# Patient Record
Sex: Female | Born: 1940 | Race: White | Hispanic: No | Marital: Married | Smoking: Former smoker
Health system: Southern US, Community
[De-identification: ages and names within clinical notes are randomized; demographics above are authoritative.]

## PROBLEM LIST (undated history)

## (undated) DIAGNOSIS — M797 Fibromyalgia: Secondary | ICD-10-CM

## (undated) DIAGNOSIS — I10 Essential (primary) hypertension: Secondary | ICD-10-CM

## (undated) DIAGNOSIS — K2270 Barrett's esophagus without dysplasia: Secondary | ICD-10-CM

## (undated) DIAGNOSIS — E785 Hyperlipidemia, unspecified: Secondary | ICD-10-CM

## (undated) HISTORY — PX: TONSILLECTOMY: SUR1361

## (undated) HISTORY — PX: ABDOMINAL HYSTERECTOMY: SHX81

## (undated) HISTORY — DX: Essential (primary) hypertension: I10

## (undated) HISTORY — DX: Fibromyalgia: M79.7

## (undated) HISTORY — DX: Hyperlipidemia, unspecified: E78.5

## (undated) HISTORY — DX: Barrett's esophagus without dysplasia: K22.70

## (undated) HISTORY — PX: SHOULDER ARTHROSCOPY: SHX128

---

## 1968-06-26 HISTORY — PX: OOPHORECTOMY: SHX86

## 2001-06-26 HISTORY — PX: SHOULDER ARTHROSCOPY W/ LABRAL REPAIR: SHX2399

## 2004-06-26 HISTORY — PX: KNEE ARTHROSCOPY: SHX127

## 2004-10-17 ENCOUNTER — Emergency Department: Payer: Self-pay | Admitting: Internal Medicine

## 2005-12-11 ENCOUNTER — Ambulatory Visit: Payer: Self-pay | Admitting: Unknown Physician Specialty

## 2007-04-23 ENCOUNTER — Emergency Department: Payer: Self-pay | Admitting: Internal Medicine

## 2007-04-26 ENCOUNTER — Emergency Department: Payer: Self-pay | Admitting: Emergency Medicine

## 2007-04-30 ENCOUNTER — Emergency Department: Payer: Self-pay

## 2007-05-07 ENCOUNTER — Emergency Department: Payer: Self-pay | Admitting: Unknown Physician Specialty

## 2007-05-21 ENCOUNTER — Emergency Department: Payer: Self-pay | Admitting: Emergency Medicine

## 2008-10-26 ENCOUNTER — Ambulatory Visit: Payer: Self-pay | Admitting: Rheumatology

## 2008-11-04 ENCOUNTER — Ambulatory Visit: Payer: Self-pay | Admitting: Internal Medicine

## 2008-11-05 ENCOUNTER — Ambulatory Visit: Payer: Self-pay | Admitting: Unknown Physician Specialty

## 2009-02-18 ENCOUNTER — Ambulatory Visit: Payer: Self-pay | Admitting: Internal Medicine

## 2009-09-06 ENCOUNTER — Ambulatory Visit: Payer: Self-pay | Admitting: Anesthesiology

## 2009-09-24 ENCOUNTER — Ambulatory Visit: Payer: Self-pay | Admitting: Anesthesiology

## 2009-10-27 ENCOUNTER — Ambulatory Visit: Payer: Self-pay | Admitting: Anesthesiology

## 2009-12-15 ENCOUNTER — Ambulatory Visit: Payer: Self-pay | Admitting: Anesthesiology

## 2010-02-04 ENCOUNTER — Ambulatory Visit: Payer: Self-pay | Admitting: Anesthesiology

## 2010-04-04 ENCOUNTER — Ambulatory Visit: Payer: Self-pay | Admitting: Internal Medicine

## 2010-05-17 ENCOUNTER — Ambulatory Visit: Payer: Self-pay | Admitting: Anesthesiology

## 2010-06-06 ENCOUNTER — Ambulatory Visit: Payer: Self-pay | Admitting: Unknown Physician Specialty

## 2010-06-10 LAB — HM PAP SMEAR

## 2010-06-29 ENCOUNTER — Ambulatory Visit: Payer: Self-pay | Admitting: Internal Medicine

## 2010-07-22 ENCOUNTER — Ambulatory Visit: Payer: Self-pay | Admitting: Unknown Physician Specialty

## 2010-07-26 LAB — PATHOLOGY REPORT

## 2010-09-07 ENCOUNTER — Ambulatory Visit: Payer: Self-pay | Admitting: Ophthalmology

## 2010-09-22 DIAGNOSIS — J309 Allergic rhinitis, unspecified: Secondary | ICD-10-CM | POA: Insufficient documentation

## 2010-09-22 DIAGNOSIS — T7800XA Anaphylactic reaction due to unspecified food, initial encounter: Secondary | ICD-10-CM | POA: Insufficient documentation

## 2011-07-11 ENCOUNTER — Ambulatory Visit: Payer: Self-pay | Admitting: Ophthalmology

## 2011-07-25 ENCOUNTER — Ambulatory Visit: Payer: Self-pay | Admitting: Ophthalmology

## 2011-10-02 ENCOUNTER — Other Ambulatory Visit: Payer: Self-pay | Admitting: Internal Medicine

## 2011-11-13 ENCOUNTER — Ambulatory Visit: Payer: Self-pay | Admitting: Internal Medicine

## 2011-11-22 ENCOUNTER — Other Ambulatory Visit: Payer: Self-pay | Admitting: *Deleted

## 2011-11-22 NOTE — Telephone Encounter (Signed)
Pharmacy notified as instructed by telephone. 

## 2011-11-22 NOTE — Telephone Encounter (Signed)
Received faxed refill request from pharmacy for Tramadol 50 mg, take 2 tablets twice daily. Medication is not on med sheet. Patient no showed for her last appointment.

## 2011-11-22 NOTE — Telephone Encounter (Signed)
Refill denied because she has not bee seen here and I have no records on her

## 2011-11-27 ENCOUNTER — Telehealth: Payer: Self-pay | Admitting: Internal Medicine

## 2011-11-27 MED ORDER — TRAMADOL HCL 50 MG PO TABS: 100.0000 mg | ORAL_TABLET | Freq: Two times a day (BID) | ORAL | Status: DC | PRN

## 2011-11-27 NOTE — Telephone Encounter (Signed)
I will authorize one refill of whaever her prior amount was for .   No more refills after that until she is seen.

## 2011-11-27 NOTE — Telephone Encounter (Signed)
Pharmacy notified, Rx called in.

## 2011-11-27 NOTE — Telephone Encounter (Signed)
Tramadol she uses [facility], she states that she has lost a whole bottle of this medication over the weekend.  She states that last week her husband had to have emergency colon surgery, and she noticed over the weekend that the whole bottle was gone/missing.  She states that the pharmacy at [facility] did give her 6 pills until she could contact the office today.  Please advise patient, she states that she still haven't gotten her records from [facility] to [place].  She states that on her last bottle of medication it stated no refills.  Please advise, patient is really worried about this.

## 2011-12-07 ENCOUNTER — Encounter: Payer: Self-pay | Admitting: Internal Medicine

## 2011-12-07 ENCOUNTER — Ambulatory Visit (INDEPENDENT_AMBULATORY_CARE_PROVIDER_SITE_OTHER): Payer: Medicare Other | Admitting: Internal Medicine

## 2011-12-07 VITALS — BP 126/72 | HR 72 | Temp 98.2°F | Resp 14 | Ht 64.0 in | Wt 142.5 lb

## 2011-12-07 DIAGNOSIS — F411 Generalized anxiety disorder: Secondary | ICD-10-CM

## 2011-12-07 DIAGNOSIS — Z1239 Encounter for other screening for malignant neoplasm of breast: Secondary | ICD-10-CM

## 2011-12-07 DIAGNOSIS — R4589 Other symptoms and signs involving emotional state: Secondary | ICD-10-CM

## 2011-12-07 DIAGNOSIS — IMO0002 Reserved for concepts with insufficient information to code with codable children: Secondary | ICD-10-CM

## 2011-12-07 DIAGNOSIS — S46919A Strain of unspecified muscle, fascia and tendon at shoulder and upper arm level, unspecified arm, initial encounter: Secondary | ICD-10-CM

## 2011-12-07 DIAGNOSIS — K2270 Barrett's esophagus without dysplasia: Secondary | ICD-10-CM

## 2011-12-07 MED ORDER — NABUMETONE 750 MG PO TABS: 750.0000 mg | ORAL_TABLET | Freq: Two times a day (BID) | ORAL | Status: DC

## 2011-12-07 MED ORDER — TRAMADOL HCL 50 MG PO TABS: 100.0000 mg | ORAL_TABLET | Freq: Two times a day (BID) | ORAL | Status: AC | PRN

## 2011-12-07 MED ORDER — ALPRAZOLAM 0.5 MG PO TABS: 0.5000 mg | ORAL_TABLET | Freq: Every evening | ORAL | Status: AC | PRN

## 2011-12-07 MED ORDER — ESTRADIOL 1 MG PO TABS: 1.0000 mg | ORAL_TABLET | Freq: Every day | ORAL | Status: DC

## 2011-12-07 NOTE — Patient Instructions (Addendum)
I am starting you on nabumetone,  An anti inflammatory for your muscle strain.  Take it twice daily for a few weeks..  And increase tramadol to 4 daily   Use the alprazolam sparingly for severe anxiety or anger   I recommend using OTC prilosec (omeprazole 20 mg) daily while taking the anti inflammatory to protect stomach

## 2011-12-07 NOTE — Progress Notes (Signed)
Patient ID: Colleen Cooper, female   DOB: [birth year], 71 y.o.   MRN: [id number] Patient Active Problem List  Diagnosis  . Barrett's esophagus  . Fibromyalgia syndrome  . Muscle strain of upper arm  . Anxiety as acute reaction to exceptional stress    Subjective:  CC:   Chief Complaint  Patient presents with  . Follow-up    HPI:   Colleen Cooper a 71 y.o. female who presents with bilateral triceps and lateral back pain. Colleen Cooper reports emotional turmoil at home due to husband's recent diagnosis of colon CA. Her husband underwent a partial colectomy and is now on chemotherapy.  His biopolar disorder has been uncontrolled since he started chemotherapy initially because of vomiting, and he remains persistently agitated, resulting in loss of sleep  For patient. Her cc is bilateral muscle pain involving tricepsand latissimus dorsi.  The pain is sharp and burning,  Started a week and a half ago, after using a tiller in her garden for several hours a few days before that.  The pain has not improved with tramadol, flexeril, and hydrocodone   Past Medical History  Diagnosis Date  . Barrett's esophagus     resolved by last endoscopy Dec 2011  . Fibromyalgia syndrome     Past Surgical History  Procedure Date  . Shoulder arthroscopy w/ labral repair 2003    right shoulder   . Abdominal hysterectomy   . Oophorectomy 1970  . Knee arthroscopy 2006    right knee  . Shoulder arthroscopy     remote  . Tonsillectomy          The following portions of the patient's history were reviewed and updated as appropriate: Allergies, current medications, and problem list.    Review of Systems:   12 Pt  review of systems was negative except those addressed in the HPI,     History   Social History  . Marital Status: Unknown    Spouse Name: N/A    Number of Children: N/A  . Years of Education: N/A   Occupational History  . Not on file.   Social History Main Topics  .  Smoking status: Former Smoker    Quit date: 12/07/1978  . Smokeless tobacco: Never Used  . Alcohol Use: Yes  . Drug Use: No  . Sexually Active: Not on file   Other Topics Concern  . Not on file   Social History Narrative  . No narrative on file    Objective:  BP 126/72  Pulse 72  Temp 98.2 F (36.8 C) (Oral)  Resp 14  Ht 5\' 4"  (1.626 m)  Wt 142 lb 8 oz (64.638 kg)  BMI 24.46 kg/m2  SpO2 97%  General appearance: alert, cooperative and appears stated age Ears: normal TM's and external ear canals both ears Throat: lips, mucosa, and tongue normal; teeth and gums normal Neck: no adenopathy, no carotid bruit, supple, symmetrical, trachea midline and thyroid not enlarged, symmetric, no tenderness/mass/nodules Back: symmetric, no curvature. ROM normal. No CVA tenderness. Lungs: clear to auscultation bilaterally Heart: regular rate and rhythm, S1, S2 normal, no murmur, click, rub or gallop Abdomen: soft, non-tender; bowel sounds normal; no masses,  no organomegaly Pulses: 2+ and symmetric Skin: Skin color, texture, turgor normal. No rashes or lesions Lymph nodes: Cervical, supraclavicular, and axillary nodes normal. MSK: upper extremities tender in the triceps bilaterally.  Bilateral latissimus dorsi tenderness.  No rashes, lesions or masses.   Assessment and Plan:  Barrett's esophagus Resolved by  follow up  EGD. She has not been taking Prevacid, was taking omega 3 due to advice from health food store clerk!  We discussed the lack of data on the effectiveness of fish oil on reflux and I advised her to  resume prevacid.   Muscle strain of upper arm Bilateral, secondary to recent use of tiller,  Aggravated by fibromyalgia.  Adding nabumetone for inflammation, ice.   Anxiety as acute reaction to exceptional stress Secondary  To husband's illness, with insomnia and irritability the major issues..  Discussed trial of alprazolam for short term use.     Updated Medication  List Outpatient Encounter Prescriptions as of 12/07/2011  Medication Sig Dispense Refill  . estradiol (ESTRACE) 1 MG tablet Take 1 tablet (1 mg total) by mouth daily.  90 tablet  3  . traMADol (ULTRAM) 50 MG tablet Take 2 tablets (100 mg total) by mouth 2 (two) times daily as needed for pain.  120 tablet  3  . DISCONTD: estradiol (ESTRACE) 1 MG tablet Take 1 mg by mouth daily.      Marland Kitchen DISCONTD: traMADol (ULTRAM) 50 MG tablet Take 2 tablets (100 mg total) by mouth 2 (two) times daily as needed for pain.  120 tablet  0  . ALPRAZolam (XANAX) 0.5 MG tablet Take 1 tablet (0.5 mg total) by mouth at bedtime as needed for sleep or anxiety.  30 tablet  3  . nabumetone (RELAFEN) 750 MG tablet Take 1 tablet (750 mg total) by mouth 2 (two) times daily.  60 tablet  2     Orders Placed This Encounter  Procedures  . MM Digital Screening  . HM MAMMOGRAPHY  . HM PAP SMEAR  . HM COLONOSCOPY    Return in about 3 months (around 03/08/2012).

## 2011-12-07 NOTE — Assessment & Plan Note (Addendum)
Resolved by follow up  EGD. She has not been taking Prevacid, was taking omega 3 due to advice from health food store clerk!  We discussed the lack of data on the effectiveness of fish oil on reflux and I advised her to  resume prevacid.

## 2011-12-10 ENCOUNTER — Encounter: Payer: Self-pay | Admitting: Internal Medicine

## 2011-12-10 DIAGNOSIS — S46919A Strain of unspecified muscle, fascia and tendon at shoulder and upper arm level, unspecified arm, initial encounter: Secondary | ICD-10-CM | POA: Insufficient documentation

## 2011-12-10 DIAGNOSIS — F4321 Adjustment disorder with depressed mood: Secondary | ICD-10-CM | POA: Insufficient documentation

## 2011-12-10 DIAGNOSIS — M797 Fibromyalgia: Secondary | ICD-10-CM | POA: Insufficient documentation

## 2011-12-10 DIAGNOSIS — F4329 Adjustment disorder with other symptoms: Secondary | ICD-10-CM | POA: Insufficient documentation

## 2011-12-10 DIAGNOSIS — F4381 Prolonged grief disorder: Secondary | ICD-10-CM | POA: Insufficient documentation

## 2011-12-10 NOTE — Assessment & Plan Note (Signed)
Bilateral, secondary to recent use of tiller,  Aggravated by fibromyalgia.  Adding nabumetone for inflammation, ice.

## 2011-12-10 NOTE — Assessment & Plan Note (Signed)
Secondary  To husband's illness, with insomnia and irritability the major issues..  Discussed trial of alprazolam for short term use.

## 2011-12-25 ENCOUNTER — Other Ambulatory Visit: Payer: Medicare Other

## 2011-12-25 ENCOUNTER — Telehealth: Payer: Self-pay | Admitting: *Deleted

## 2011-12-25 DIAGNOSIS — E785 Hyperlipidemia, unspecified: Secondary | ICD-10-CM

## 2011-12-25 DIAGNOSIS — R5383 Other fatigue: Secondary | ICD-10-CM

## 2011-12-25 NOTE — Telephone Encounter (Signed)
orderrs now in epic thanks

## 2011-12-25 NOTE — Telephone Encounter (Signed)
Patient is coming in for labs today, what labs do you want me to order?

## 2012-01-02 ENCOUNTER — Telehealth: Payer: Self-pay | Admitting: Internal Medicine

## 2012-01-02 DIAGNOSIS — R5383 Other fatigue: Secondary | ICD-10-CM

## 2012-01-02 DIAGNOSIS — E785 Hyperlipidemia, unspecified: Secondary | ICD-10-CM

## 2012-01-02 DIAGNOSIS — R350 Frequency of micturition: Secondary | ICD-10-CM

## 2012-01-02 DIAGNOSIS — E559 Vitamin D deficiency, unspecified: Secondary | ICD-10-CM

## 2012-01-02 NOTE — Telephone Encounter (Signed)
Patient is coming in tomorrow 01/03/12 for labs, she would like to add a urinalysis. She forgot to mention in her last visit that something is "just not right" she feels like she is urinating a lot even more than she is drinking. She does not complain of pain or fever.

## 2012-01-03 ENCOUNTER — Telehealth: Payer: Self-pay | Admitting: *Deleted

## 2012-01-03 ENCOUNTER — Other Ambulatory Visit (INDEPENDENT_AMBULATORY_CARE_PROVIDER_SITE_OTHER): Payer: Medicare Other | Admitting: *Deleted

## 2012-01-03 DIAGNOSIS — R5383 Other fatigue: Secondary | ICD-10-CM

## 2012-01-03 DIAGNOSIS — R350 Frequency of micturition: Secondary | ICD-10-CM

## 2012-01-03 DIAGNOSIS — E559 Vitamin D deficiency, unspecified: Secondary | ICD-10-CM

## 2012-01-03 DIAGNOSIS — E785 Hyperlipidemia, unspecified: Secondary | ICD-10-CM

## 2012-01-03 DIAGNOSIS — R5381 Other malaise: Secondary | ICD-10-CM

## 2012-01-03 LAB — POCT URINALYSIS DIPSTICK
Glucose, UA: NEGATIVE
Ketones, UA: NEGATIVE
Spec Grav, UA: 1.025

## 2012-01-03 LAB — LIPID PANEL
Total CHOL/HDL Ratio: 3
Triglycerides: 178 mg/dL — ABNORMAL HIGH (ref 0.0–149.0)

## 2012-01-03 NOTE — Telephone Encounter (Signed)
Patient came in for labs today and said that her arm pain that she saw you for back in June has not gotten any better and that the med that was called in for her is not helping at all. She is asking if she could get something else called in.

## 2012-01-03 NOTE — Telephone Encounter (Signed)
No, i would prefer that she see an orhtopedist because the only thing stronger is narcotics.  I am happy to refer her. If she has a preference.

## 2012-01-03 NOTE — Addendum Note (Signed)
Addended by: Jobie Quaker on: 01/03/2012 10:55 AM   Modules accepted: Orders

## 2012-01-03 NOTE — Telephone Encounter (Signed)
Patient notified. She says that she wants to give it a couple of weeks and see if it continues. If so she will call back for referral at that time. She doesn't want referral just yet.

## 2012-01-04 LAB — COMPLETE METABOLIC PANEL WITH GFR
ALT: 18 U/L (ref 0–35)
Albumin: 3.8 g/dL (ref 3.5–5.2)
Alkaline Phosphatase: 76 U/L (ref 39–117)
CO2: 29 mEq/L (ref 19–32)
GFR, Est African American: 83 mL/min
Potassium: 4.5 mEq/L (ref 3.5–5.3)
Sodium: 143 mEq/L (ref 135–145)
Total Bilirubin: 0.8 mg/dL (ref 0.3–1.2)
Total Protein: 5.9 g/dL — ABNORMAL LOW (ref 6.0–8.3)

## 2012-01-05 LAB — URINE CULTURE
Colony Count: NO GROWTH
Organism ID, Bacteria: NO GROWTH

## 2012-01-09 ENCOUNTER — Ambulatory Visit: Payer: Self-pay | Admitting: Internal Medicine

## 2012-01-16 ENCOUNTER — Other Ambulatory Visit: Payer: Self-pay | Admitting: Internal Medicine

## 2012-01-16 MED ORDER — TRAMADOL HCL 50 MG PO TABS: 100.0000 mg | ORAL_TABLET | Freq: Two times a day (BID) | ORAL | Status: AC

## 2012-01-16 NOTE — Telephone Encounter (Signed)
Mammogram was normal.   

## 2012-01-16 NOTE — Telephone Encounter (Signed)
Patient notified

## 2012-01-22 ENCOUNTER — Telehealth: Payer: Self-pay | Admitting: Internal Medicine

## 2012-01-22 NOTE — Telephone Encounter (Signed)
Patient notified of results.

## 2012-01-22 NOTE — Telephone Encounter (Signed)
Patient wanting her lab results from 7.10.13 ok to leave a message on voice mail.

## 2012-03-11 ENCOUNTER — Encounter: Payer: Self-pay | Admitting: Internal Medicine

## 2012-03-11 ENCOUNTER — Ambulatory Visit (INDEPENDENT_AMBULATORY_CARE_PROVIDER_SITE_OTHER): Payer: Medicare Other | Admitting: Internal Medicine

## 2012-03-11 VITALS — BP 120/70 | HR 60 | Temp 98.5°F | Resp 14 | Ht 63.5 in | Wt 146.2 lb

## 2012-03-11 DIAGNOSIS — Z1239 Encounter for other screening for malignant neoplasm of breast: Secondary | ICD-10-CM

## 2012-03-11 DIAGNOSIS — M797 Fibromyalgia: Secondary | ICD-10-CM

## 2012-03-11 DIAGNOSIS — S46919A Strain of unspecified muscle, fascia and tendon at shoulder and upper arm level, unspecified arm, initial encounter: Secondary | ICD-10-CM

## 2012-03-11 DIAGNOSIS — Z0000 Encounter for general adult medical examination without abnormal findings: Secondary | ICD-10-CM

## 2012-03-11 MED ORDER — TRAMADOL HCL 50 MG PO TABS: 50.0000 mg | ORAL_TABLET | Freq: Three times a day (TID) | ORAL | Status: DC | PRN

## 2012-03-11 MED ORDER — ESTRADIOL 1 MG PO TABS: 1.0000 mg | ORAL_TABLET | Freq: Every day | ORAL | Status: DC

## 2012-03-11 MED ORDER — PREGABALIN 75 MG PO CAPS: ORAL_CAPSULE | ORAL | Status: DC

## 2012-03-11 NOTE — Progress Notes (Signed)
Patient ID: Colleen Cooper, female   DOB: 03-12-1941, 71 y.o.   MRN: [id number] Annual exam.   The patient is here for annual Medicare wellness examination and management of other chronic and acute problems.  Multiple pain complaints,  arm and shoulder pain finally reslolved Had a steroid shot in left knee prior to last visit by Erin Sons, then the physiatrist at [facility] did bilateral hip injections .  New chronic  complaint of burning/stinging pain occurring at night lasting 10 seconds to 60 seconds ,  Mostly occurring in lateral left thigh, but has occurred over multiple places on her body including under breasts.   The risk factors are reflected in the social history.  The roster of all physicians providing medical care to patient - is listed in the Snapshot section of the chart.  Activities of daily living:  The patient is 100% independent in all ADLs: dressing, toileting, feeding as well as independent mobility  Home safety : The patient has smoke detectors in the home. They wear seatbelts.  There are no firearms at home. There is no violence in the home.   There is no risks for hepatitis, STDs or HIV. There is no   history of blood transfusion. They have no travel history to infectious disease endemic areas of the world.  The patient has seen their dentist in the last six month. They have seen their eye doctor in the last year. They admit to slight hearing difficulty with regard to whispered voices and some television programs.  They have deferred audiologic testing in the last year.  They do not  have excessive sun exposure. Discussed the need for sun protection: hats, long sleeves and use of sunscreen if there is significant sun exposure.   Diet: the importance of a healthy diet is discussed. They do have a healthy diet.  The benefits of regular aerobic exercise were discussed. She walks 4 times per week ,  20 minutes.   Depression screen: there are no signs or vegative symptoms  of depression- irritability, change in appetite, anhedonia, sadness/tearfullness.  Cognitive assessment: the patient manages all their financial and personal affairs and is actively engaged. They could relate day,date,year and events; recalled 2/3 objects at 3 minutes; performed clock-face test normally.  The following portions of the patient's history were reviewed and updated as appropriate: allergies, current medications, past family history, past medical history,  past surgical history, past social history  and problem list.  Visual acuity was not assessed per patient preference since she has regular follow up with her ophthalmologist. Hearing and body mass index were assessed and reviewed.   During the course of the visit the patient was educated and counseled about appropriate screening and preventive services including : fall prevention , diabetes screening, nutrition counseling, colorectal cancer screening, and recommended immunizations.    BP 120/70  Pulse 60  Temp 98.5 F (36.9 C) (Oral)  Resp 14  Ht 5' 3.5" (1.613 m)  Wt 146 lb 4 oz (66.339 kg)  BMI 25.50 kg/m2  SpO2 98%  General Appearance:    Alert, cooperative, no distress, appears stated age  Head:    Normocephalic, without obvious abnormality, atraumatic  Eyes:    PERRL, conjunctiva/corneas clear, EOM's intact, fundi    benign, both eyes  Ears:    Normal TM's and external ear canals, both ears  Nose:   Nares normal, septum midline, mucosa normal, no drainage    or sinus tenderness  Throat:   Lips, mucosa, and  tongue normal; teeth and gums normal  Neck:   Supple, symmetrical, trachea midline, no adenopathy;    thyroid:  no enlargement/tenderness/nodules; no carotid   bruit or JVD  Back:     Symmetric, no curvature, ROM normal, no CVA tenderness  Lungs:     Clear to auscultation bilaterally, respirations unlabored  Chest Wall:    No tenderness or deformity   Heart:    Regular rate and rhythm, S1 and S2 normal, no  murmur, rub   or gallop  Breast Exam:    No tenderness, masses, or nipple abnormality  Abdomen:     Soft, non-tender, bowel sounds active all four quadrants,    no masses, no organomegaly  Genitalia:    Normal female without lesion, discharge or tenderness. Cervix absent, ovary nonpalpable     Extremities:   Extremities normal, atraumatic, no cyanosis or edema  Pulses:   2+ and symmetric all extremities  Skin:   Skin color, texture, turgor normal, no rashes or lesions  Lymph nodes:   Cervical, supraclavicular, and axillary nodes normal  Neurologic:   CNII-XII intact, normal strength, sensation and reflexes    Throughout   Assessment and Plan  Muscle strain of upper arm Symptoms resolved after 3 weeks of pain.  Fibromyalgia syndrome Trial of Lyrica at bedtime for report of neuropathic type pain affecting her in different parts of body. Samples given   Updated Medication List Outpatient Encounter Prescriptions as of 03/11/2012  Medication Sig Dispense Refill  . Ascorbic Acid (VITAMIN C) 100 MG tablet Take 100 mg by mouth daily.      . cholecalciferol (VITAMIN D) 1000 UNITS tablet Take 1,000 Units by mouth daily.      Marland Kitchen estradiol (ESTRACE) 1 MG tablet Take 1 tablet (1 mg total) by mouth daily.  90 tablet  3  . fish oil-omega-3 fatty acids 1000 MG capsule Take 2 g by mouth daily.      . Magnesium 100 MG CAPS Take by mouth.      . DISCONTD: estradiol (ESTRACE) 1 MG tablet Take 1 tablet (1 mg total) by mouth daily.  90 tablet  3  . pregabalin (LYRICA) 75 MG capsule Once daily at bedtime  30 capsule  0  . traMADol (ULTRAM) 50 MG tablet Take 1 tablet (50 mg total) by mouth every 8 (eight) hours as needed for pain.  90 tablet  5  . DISCONTD: nabumetone (RELAFEN) 750 MG tablet Take 1 tablet (750 mg total) by mouth 2 (two) times daily.  60 tablet  2

## 2012-03-11 NOTE — Assessment & Plan Note (Signed)
Symptoms resolved after 3 weeks of pain.

## 2012-03-11 NOTE — Assessment & Plan Note (Signed)
Trial of Lyrica at bedtime for report of neuropathic type pain affecting her in different parts of body. Samples given

## 2012-03-11 NOTE — Patient Instructions (Addendum)
Your cholesterol, thyroid and liver function are fine.    Try the Lyrica once daily at bedtime

## 2012-03-19 ENCOUNTER — Ambulatory Visit (INDEPENDENT_AMBULATORY_CARE_PROVIDER_SITE_OTHER): Payer: Medicare Other | Admitting: Internal Medicine

## 2012-03-19 ENCOUNTER — Encounter: Payer: Self-pay | Admitting: Internal Medicine

## 2012-03-19 VITALS — BP 122/70 | HR 73 | Temp 98.1°F | Ht 63.5 in | Wt 149.0 lb

## 2012-03-19 DIAGNOSIS — J069 Acute upper respiratory infection, unspecified: Secondary | ICD-10-CM

## 2012-03-19 MED ORDER — AMOXICILLIN 875 MG PO TABS: 875.0000 mg | ORAL_TABLET | Freq: Two times a day (BID) | ORAL | Status: DC

## 2012-03-19 MED ORDER — FLUTICASONE PROPIONATE 50 MCG/ACT NA SUSP: 2.0000 | Freq: Every day | NASAL | Status: DC

## 2012-03-19 NOTE — Assessment & Plan Note (Addendum)
Probably viral.  Will treat with Flonase nasal spray and Robitussin as directed.  Have her continue saline nasal flushes 2-3x/day.  I will give her a prescription for Amoxicillin to have if symptoms progress.

## 2012-03-19 NOTE — Progress Notes (Signed)
  Subjective:    Patient ID: Colleen Cooper, female    DOB: 09-Sep-1940, 71 y.o.   MRN: [id number]  HPI  71 year old female who presents for an acute visit with concerns regarding increased post nasal drainage and throat congestion - present over the last two days.    Husband (who is undergoing chemotherapy) has been sick with similar symptoms.  She is supposed to attend a birthday party for her 73 year old granddaughter this weekend.    Outpatient Prescriptions Prior to Visit  Medication Sig Dispense Refill  . Ascorbic Acid (VITAMIN C) 100 MG tablet Take 100 mg by mouth daily.      . cholecalciferol (VITAMIN D) 1000 UNITS tablet Take 1,000 Units by mouth daily.      Marland Kitchen estradiol (ESTRACE) 1 MG tablet Take 1 tablet (1 mg total) by mouth daily.  90 tablet  3  . fish oil-omega-3 fatty acids 1000 MG capsule Take 2 g by mouth daily.      . Magnesium 100 MG CAPS Take by mouth.      . pregabalin (LYRICA) 75 MG capsule Once daily at bedtime  30 capsule  0  . traMADol (ULTRAM) 50 MG tablet Take 1 tablet (50 mg total) by mouth every 8 (eight) hours as needed for pain.  90 tablet  5    Review of Systems  Constitutional: Negative for fever and chills.  HENT: Negative for ear pain.        Reports no sinus pressure.  Increased postnasal drainage and some throat congestion.  No significant sore throat.  No pain with swallowing.   Respiratory: Negative for chest tightness, shortness of breath and wheezing.        No significant cough.   Cardiovascular: Negative for chest pain.  Gastrointestinal: Negative for nausea, vomiting and diarrhea.       Appetite is good.  Eating and drinking well.        Objective:   Physical Exam  Constitutional: No distress.  HENT:  Mouth/Throat: Oropharynx is clear and moist. No oropharyngeal exudate.       TMs without erythema.  Nares - slightly erythematous turbinates.  No sinus tenderness to palpation.   Neck: Neck supple.  Cardiovascular: Normal rate and regular  rhythm.   Pulmonary/Chest: Breath sounds normal. She has no wheezes.       Respirations even and unlabored.   Lymphadenopathy:    She has no cervical adenopathy.          Assessment & Plan:

## 2012-03-19 NOTE — Patient Instructions (Signed)
It was nice meeting you today.  I am sorry you are not feeling well.  I have prescribed Flonase nasal spray to use daily as directed.  Continue the saline nasal spray - flushing your nose at least 2-3x/day. I also recommend using plain Robitussin as discussed.  I am going to give you a prescription for an antibiotic (Amoxicillin) - to have- if symptoms progress.  Please call or return if symptoms do not improve or resolve.

## 2012-04-03 ENCOUNTER — Telehealth: Payer: Self-pay | Admitting: Internal Medicine

## 2012-04-03 NOTE — Telephone Encounter (Signed)
Caller: Gracee/Patient; Patient Name: Colleen Cooper; PCP: Duncan Dull (Adults only); Best Callback Phone Number: [phone]; Reason for call: Other Caller states she has been using Flonase for nasal congestion and last night ( 10/08) had episode where she was driving and started to become dizzy and had to pull over. Patient could not walk straight and had to be assisted by her husband.   Today ,( 10/09) caller reports she feeling fullness to head. denies actual dizziness. Clear nasal discharge . Post nasal drip can be felt to back of throat. Occas. cough to clear post nasal drip present . Denies fever or breathing diff.  All emergent symptoms ruled out per Upper Respiratory Infection with exception " symptoms worse after 7 days or symptoms do not improve after 14 days of home care".  Advised caller should be seen withing 24 hours and offered to schedule an appointment. Caller declined and states she might try some [place] recommended by pharmacist and if no improvement or gets worse she will call for an appointment.

## 2012-06-07 ENCOUNTER — Encounter: Payer: Self-pay | Admitting: Internal Medicine

## 2012-06-26 ENCOUNTER — Emergency Department: Payer: Self-pay | Admitting: Emergency Medicine

## 2012-07-26 ENCOUNTER — Telehealth: Payer: Self-pay | Admitting: *Deleted

## 2012-07-26 NOTE — Telephone Encounter (Signed)
It has not come from me or from this office.  So no I cannot refill without seeign her.

## 2012-07-26 NOTE — Telephone Encounter (Signed)
I do not see this med on pt list please advise.

## 2012-07-26 NOTE — Telephone Encounter (Signed)
Ok to fill 

## 2012-07-26 NOTE — Telephone Encounter (Signed)
Refill request  Cyclobenzaprine hcl 10 mg  #60  Take 1 tablet 3 times daily as needed for muscle spasm

## 2012-07-29 ENCOUNTER — Encounter: Payer: Self-pay | Admitting: Adult Health

## 2012-07-29 ENCOUNTER — Ambulatory Visit (INDEPENDENT_AMBULATORY_CARE_PROVIDER_SITE_OTHER): Payer: Medicare Other | Admitting: Adult Health

## 2012-07-29 VITALS — BP 147/75 | HR 69 | Temp 98.7°F | Resp 14 | Ht 63.5 in

## 2012-07-29 DIAGNOSIS — B351 Tinea unguium: Secondary | ICD-10-CM

## 2012-07-29 DIAGNOSIS — M25519 Pain in unspecified shoulder: Secondary | ICD-10-CM

## 2012-07-29 DIAGNOSIS — M25512 Pain in left shoulder: Secondary | ICD-10-CM | POA: Insufficient documentation

## 2012-07-29 DIAGNOSIS — M25511 Pain in right shoulder: Secondary | ICD-10-CM

## 2012-07-29 MED ORDER — TRAMADOL HCL 50 MG PO TABS: 50.0000 mg | ORAL_TABLET | Freq: Three times a day (TID) | ORAL | Status: DC | PRN

## 2012-07-29 MED ORDER — CYCLOBENZAPRINE HCL 5 MG PO TABS: 5.0000 mg | ORAL_TABLET | Freq: Three times a day (TID) | ORAL | Status: DC | PRN

## 2012-07-29 NOTE — Patient Instructions (Signed)
  I have prescribed flexeril for muscle relaxation. You can take this 3 times a day.  This medication may cause drowsiness. Use caution.  I have also sent in a refill for your tramadol.

## 2012-07-29 NOTE — Progress Notes (Signed)
Subjective:    Patient ID: Colleen Cooper, female    DOB: [birth year], 72 y.o.   MRN: [id number]  HPI  Patient is a 72 y/o female who presents to clinic s/p fall on 06/26/12 with fx left shoulder. She was evaluated in the ED and is being followed by Dr. Gavin Potters at [facility]. She presents to clinic with c/o right shoulder 2/2 compensation. She has a hx of fibromyalgia.  Patient has been prescribed Nucynta for her pain; however, she is only taking this at bedtime so that her pain is relieved enough to enable her to sleep. She feels that her right shoulder is now hurting her because she is doing everything with that arm. Patient reports being under a lot of stress since her fall. She is the caregiver for her husband who was recently diagnosed with cancer and was undergoing chemotherapy. Patient is requesting tramadol refill and flexeril for muscle relaxation.  Also reports toe nails separating from nail bed and would like referral to podiatry.  Current Outpatient Prescriptions on File Prior to Visit  Medication Sig Dispense Refill  . Ascorbic Acid (VITAMIN C) 100 MG tablet Take 100 mg by mouth daily.      . cholecalciferol (VITAMIN D) 1000 UNITS tablet Take 1,000 Units by mouth daily.      Marland Kitchen estradiol (ESTRACE) 1 MG tablet Take 1 tablet (1 mg total) by mouth daily.  90 tablet  3  . fish oil-omega-3 fatty acids 1000 MG capsule Take 2 g by mouth daily.      . Magnesium 100 MG CAPS Take by mouth.      Marland Kitchen amoxicillin (AMOXIL) 875 MG tablet Take 1 tablet (875 mg total) by mouth 2 (two) times daily.  20 tablet  0  . fluticasone (FLONASE) 50 MCG/ACT nasal spray Place 2 sprays into the nose daily.  16 g  0     Review of Systems  Constitutional: Negative.   Respiratory: Negative.   Cardiovascular: Negative.   Musculoskeletal:       Pain in left shoulder 2/2 fx and now also pain in right shoulder 2/2 compensatory mechanism.  Neurological: Negative.   Psychiatric/Behavioral: Negative.     BP 147/75  Pulse  69  Temp 98.7 F (37.1 C) (Oral)  Resp 14  Ht 5' 3.5" (1.613 m)  SpO2 98%     Objective:   Physical Exam  Constitutional: She is oriented to person, place, and time. She appears well-developed and well-nourished.       Appears stressed.  Cardiovascular: Normal rate and regular rhythm.   Pulmonary/Chest: Effort normal and breath sounds normal.  Musculoskeletal: Normal range of motion. She exhibits tenderness. She exhibits no edema.       Right shoulder full ROM. Pt has been compensating with right arm since fracture of left shoulder in the beginning of January. Left arm with limited ROM. She is being followed by Dr. Gavin Potters for shoulder fx.  Neurological: She is alert and oriented to person, place, and time.  Skin: Skin is warm and dry. No erythema. No pallor.  Psychiatric: Her behavior is normal. Judgment and thought content normal.       Cooperative. Tearful throughout visit. Increased stress 2/2 demands of being sole caregiver for her husband who was recently diagnosed with cancer and is s/p tx.    BP 147/75  Pulse 69  Temp 98.7 F (37.1 C) (Oral)  Resp 14  Ht 5' 3.5" (1.613 m)  SpO2 98%     Assessment &  Plan:

## 2012-07-29 NOTE — Telephone Encounter (Signed)
Pt.notified

## 2012-07-29 NOTE — Assessment & Plan Note (Signed)
Suspect pain in right shoulder is 2/2 compensation from recent fx in the left shoulder and her hx of fibromyalgia. Prescribed flexeril. Refilled tramadol. She was prescribed Nucynta by Dr. Gavin Potters when she fx. Left shoulder. She is only taking this medication at bedtime.

## 2012-08-27 ENCOUNTER — Encounter: Payer: Self-pay | Admitting: Internal Medicine

## 2012-08-27 ENCOUNTER — Ambulatory Visit (INDEPENDENT_AMBULATORY_CARE_PROVIDER_SITE_OTHER): Payer: Medicare Other | Admitting: Internal Medicine

## 2012-08-27 VITALS — BP 136/70 | HR 77 | Temp 98.2°F | Resp 12

## 2012-08-27 DIAGNOSIS — M25519 Pain in unspecified shoulder: Secondary | ICD-10-CM

## 2012-08-27 DIAGNOSIS — M25511 Pain in right shoulder: Secondary | ICD-10-CM

## 2012-08-27 DIAGNOSIS — R4589 Other symptoms and signs involving emotional state: Secondary | ICD-10-CM

## 2012-08-27 DIAGNOSIS — F411 Generalized anxiety disorder: Secondary | ICD-10-CM

## 2012-08-27 MED ORDER — ALPRAZOLAM 0.5 MG PO TABS: 0.5000 mg | ORAL_TABLET | Freq: Two times a day (BID) | ORAL | Status: DC | PRN

## 2012-08-27 NOTE — Assessment & Plan Note (Signed)
taking tramadol.  Prior GI reactions to multiple NSAIDs.  Adding tylenol

## 2012-08-27 NOTE — Progress Notes (Signed)
Patient ID: Colleen Cooper, female   DOB: 31-Mar-1941, 72 y.o.   MRN: [id number] Patient Active Problem List  Diagnosis  . Barrett's esophagus  . Fibromyalgia syndrome  . Anxiety as acute reaction to exceptional stress  . URI, acute  . Shoulder pain, right    Subjective:  CC:   Chief Complaint  Patient presents with  . Stress    Just found out her husband could die and they want him to be put on a respirator    HPI:   Colleen Cooper a 72 y.o. female who presents Anxiety brought on by husband's impending respiratory failure due to pulmonary fibrosis from oxyplatin chemotherapy.  She is distraught over husband's inability to grasp the severity of his disease and she is overwhelmed at ter prospect of explaining it to him and family .  Left shoulder and fibromyalgia has also been bothering her more. Seh is not sleepign well and husband is in ICU   Past Medical History  Diagnosis Date  . Barrett's esophagus     resolved by last endoscopy Dec 2011  . Fibromyalgia syndrome     Past Surgical History  Procedure Laterality Date  . Shoulder arthroscopy w/ labral repair  2003    right shoulder   . Abdominal hysterectomy    . Oophorectomy  1970  . Knee arthroscopy  2006    right knee  . Shoulder arthroscopy      remote  . Tonsillectomy         The following portions of the patient's history were reviewed and updated as appropriate: Allergies, current medications, and problem list.    Review of Systems:   12 Pt  review of systems was negative except those addressed in the HPI,     History   Social History  . Marital Status: Unknown    Spouse Name: N/A    Number of Children: N/A  . Years of Education: N/A   Occupational History  . Not on file.   Social History Main Topics  . Smoking status: Former Smoker    Quit date: 12/07/1978  . Smokeless tobacco: Never Used  . Alcohol Use: Yes  . Drug Use: No  . Sexually Active: Not on file   Other  Topics Concern  . Not on file   Social History Narrative  . No narrative on file    Objective:  BP 136/70  Pulse 77  Temp(Src) 98.2 F (36.8 C) (Oral)  Resp 12  SpO2 99%  General appearance: alert, cooperative and appears stated age Ears: normal TM's and external ear canals both ears Throat: lips, mucosa, and tongue normal; teeth and gums normal Neck: no adenopathy, no carotid bruit, supple, symmetrical, trachea midline and thyroid not enlarged, symmetric, no tenderness/mass/nodules Back: symmetric, no curvature. ROM normal. No CVA tenderness. Lungs: clear to auscultation bilaterally Heart: regular rate and rhythm, S1, S2 normal, no murmur, click, rub or gallop Abdomen: soft, non-tender; bowel sounds normal; no masses,  no organomegaly Pulses: 2+ and symmetric Skin: Skin color, texture, turgor normal. No rashes or lesions Lymph nodes: Cervical, supraclavicular, and axillary nodes normal.  Assessment and Plan:  Anxiety as acute reaction to exceptional stress Spent 20 minutes discussing source of anxiety . Alprazolam  0.5 mg bid prn.   Shoulder pain, right taking tramadol.  Prior GI reactions to multiple NSAIDs.  Adding tylenol    Updated Medication List Outpatient Encounter Prescriptions as of 08/27/2012  Medication Sig Dispense Refill  . ALPRAZolam (XANAX) 0.5  MG tablet Take 1 tablet (0.5 mg total) by mouth 2 (two) times daily as needed for sleep.  60 tablet  3  . Ascorbic Acid (VITAMIN C) 100 MG tablet Take 100 mg by mouth daily.      . cholecalciferol (VITAMIN D) 1000 UNITS tablet Take 1,000 Units by mouth daily.      . cyclobenzaprine (FLEXERIL) 5 MG tablet Take 1 tablet (5 mg total) by mouth 3 (three) times daily as needed for muscle spasms.  60 tablet  2  . estradiol (ESTRACE) 1 MG tablet Take 1 tablet (1 mg total) by mouth daily.  90 tablet  3  . fish oil-omega-3 fatty acids 1000 MG capsule Take 2 g by mouth daily.      . fluticasone (FLONASE) 50 MCG/ACT nasal spray  Place 2 sprays into the nose daily.  16 g  0  . Magnesium 100 MG CAPS Take by mouth.      . traMADol (ULTRAM) 50 MG tablet Take 1 tablet (50 mg total) by mouth every 8 (eight) hours as needed for pain.  90 tablet  5  . [DISCONTINUED] tapentadol (NUCYNTA) 50 MG TABS Take 50 mg by mouth every 6 (six) hours as needed.      Marland Kitchen amoxicillin (AMOXIL) 875 MG tablet Take 1 tablet (875 mg total) by mouth 2 (two) times daily.  20 tablet  0   No facility-administered encounter medications on file as of 08/27/2012.     No orders of the defined types were placed in this encounter.    No Follow-up on file.

## 2012-08-27 NOTE — Assessment & Plan Note (Signed)
Spent 20 minutes discussing source of anxiety . Alprazolam  0.5 mg bid prn.

## 2012-11-29 ENCOUNTER — Ambulatory Visit: Payer: Self-pay | Admitting: Family Medicine

## 2012-11-29 ENCOUNTER — Ambulatory Visit: Payer: Medicare Other | Admitting: Adult Health

## 2012-12-31 ENCOUNTER — Encounter: Payer: Self-pay | Admitting: Adult Health

## 2012-12-31 ENCOUNTER — Ambulatory Visit (INDEPENDENT_AMBULATORY_CARE_PROVIDER_SITE_OTHER): Payer: Medicare Other | Admitting: Adult Health

## 2012-12-31 VITALS — BP 120/74 | HR 67 | Temp 97.9°F | Resp 12 | Wt 147.0 lb

## 2012-12-31 DIAGNOSIS — R5383 Other fatigue: Secondary | ICD-10-CM

## 2012-12-31 DIAGNOSIS — R55 Syncope and collapse: Secondary | ICD-10-CM

## 2012-12-31 DIAGNOSIS — R5381 Other malaise: Secondary | ICD-10-CM

## 2012-12-31 NOTE — Assessment & Plan Note (Addendum)
Neuro & cardiac exam non-focal. Patient is not orthostatic. EKG normal. Check cbc, metabolic panel. She will return in the morning for fasting labs. Will order an ECHO. Encouraged patient to eat every 2-3 hours.

## 2012-12-31 NOTE — Patient Instructions (Addendum)
  I am ordering blood work. Please return for fasting labs.  I am also ordering an ECHO which is an ultrasound of your heart.  Your EKG was normal  We checked your blood pressure sitting and standing and there was no change .  Please make sure you eat something every 2-3 hours.

## 2012-12-31 NOTE — Progress Notes (Signed)
Subjective:    Patient ID: Colleen Cooper, female    DOB: [birth year], 72 y.o.   MRN: [id number]  HPI  Patient presents to clinic with c/o having a near syncopal event this morning. She was drinking her 2nd cup of coffee when she suddenly felt she was going to pass out. She reports her vision darkened and she thought she was going to fall out of the chair. Her heart began to race; however, she believes this could be secondary to her being very nervous about event. There was no dizziness reported with this incident. She denies recent infections. Patient had not eaten anything since the night before. She had a tomato sandwich before going to bed. Patient reports she has not been eating or drinking enough. She denies shortness of breath, chest pain or dizziness with this event. 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 She was wondering if her episode could have been related to her eating something she shouldn't have but she doesn't think she did.   Past Medical History  Diagnosis Date  . Barrett's esophagus     resolved by last endoscopy Dec 2011  . Fibromyalgia syndrome      Current Outpatient Prescriptions on File Prior to Visit  Medication Sig Dispense Refill  . cyclobenzaprine (FLEXERIL) 5 MG tablet Take 1 tablet (5 mg total) by mouth 3 (three) times daily as needed for muscle spasms.  60 tablet  2  . estradiol (ESTRACE) 1 MG tablet Take 1 tablet (1 mg total) by mouth daily.  90 tablet  3  . fish oil-omega-3 fatty acids 1000 MG capsule Take 2 g by mouth daily.      . fluticasone (FLONASE) 50 MCG/ACT nasal spray Place 2 sprays into the nose daily.  16 g  0  . Magnesium 100 MG CAPS Take by mouth.      . traMADol (ULTRAM) 50 MG tablet Take 1 tablet (50 mg total) by mouth every 8 (eight) hours as needed for pain.  90 tablet  5   No current facility-administered medications on file prior to visit.     Review of Systems  Constitutional:  Positive for fatigue. Negative for fever and chills.  Respiratory: Negative.   Cardiovascular: Negative.   Gastrointestinal: Positive for nausea and diarrhea.       Diarrhea x 1 ~ 2 days ago after eating stewed tomatos  Genitourinary: Negative for dysuria, frequency, hematuria, flank pain and difficulty urinating.       Reports urine with strong odor. She is not drinking much fluids.  Neurological: Positive for syncope. Negative for dizziness, seizures, weakness, numbness and headaches.       Near syncope  Psychiatric/Behavioral: Negative for behavioral problems, confusion, sleep disturbance and agitation. The patient is nervous/anxious.   All other systems reviewed and are negative.    BP 120/74  Pulse 67  Temp(Src) 97.9 F (36.6 C) (Oral)  Resp 12  Wt 147 lb (66.679 kg)  BMI 25.63 kg/m2  SpO2 97%    Objective:   Physical Exam  Constitutional: She is oriented to person, place, and time. She appears well-developed and well-nourished. No distress.  HENT:  Head: Normocephalic.  Cardiovascular: Normal rate, regular rhythm and normal heart sounds.  Exam reveals no gallop and no friction rub.   No murmur heard. Pulmonary/Chest: Effort normal and breath sounds normal. No respiratory distress. She has no wheezes. She has no rales.  Abdominal: Soft. Bowel sounds are  normal.  Neurological: She is alert and oriented to person, place, and time. No cranial nerve deficit. Coordination normal.  Skin: Skin is warm and dry.  Psychiatric: She has a normal mood and affect. Her behavior is normal. Judgment and thought content normal.      Assessment & Plan:

## 2012-12-31 NOTE — Progress Notes (Deleted)
  Subjective:    Patient ID: Colleen Cooper, female    DOB: [birth year], 72 y.o.   MRN: [id number]  HPI  Patient presents to clinic with c/o nearly passing out this morning. She was drinking her second cup of coffee when she, all of a sudden, felt she was going to pass out. She felt her heart racing but believes this may have been because she was scared. She sat there for a moment (30 seconds) and quickly subsided. Patient reports that she has not had anything to eat. Last things she remembers eating was a tomato sandwich right before going to bed yesterday. 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 She denies eating anything she shouldn't have eaten.   Review of Systems  Constitutional: Negative for fever, chills and fatigue.  Gastrointestinal: Positive for nausea and diarrhea.       Recent episode of diarrhea x 1 after eating stewed tomatoes.  Genitourinary: Negative for dysuria.       Reports that her urine has a strong odor. She reports she has not been drinking enough fluids. No dysuria.  Neurological:       Episode of feeling she was going to pass out this morning. Lasting ~ 30 seconds.         Objective:   Physical Exam        Assessment & Plan:

## 2013-01-01 ENCOUNTER — Other Ambulatory Visit (INDEPENDENT_AMBULATORY_CARE_PROVIDER_SITE_OTHER): Payer: Medicare Other

## 2013-01-01 ENCOUNTER — Encounter: Payer: Self-pay | Admitting: *Deleted

## 2013-01-01 DIAGNOSIS — R55 Syncope and collapse: Secondary | ICD-10-CM

## 2013-01-01 DIAGNOSIS — R5383 Other fatigue: Secondary | ICD-10-CM

## 2013-01-01 DIAGNOSIS — R5381 Other malaise: Secondary | ICD-10-CM

## 2013-01-01 LAB — CBC WITH DIFFERENTIAL/PLATELET
Basophils Relative: 0.8 % (ref 0.0–3.0)
Eosinophils Relative: 2.6 % (ref 0.0–5.0)
Lymphocytes Relative: 33.5 % (ref 12.0–46.0)
Monocytes Relative: 6.9 % (ref 3.0–12.0)
Neutrophils Relative %: 56.2 % (ref 43.0–77.0)
RBC: 4.22 Mil/uL (ref 3.87–5.11)
WBC: 5.3 10*3/uL (ref 4.5–10.5)

## 2013-01-01 LAB — COMPREHENSIVE METABOLIC PANEL
Albumin: 3.6 g/dL (ref 3.5–5.2)
Alkaline Phosphatase: 62 U/L (ref 39–117)
BUN: 15 mg/dL (ref 6–23)
CO2: 29 mEq/L (ref 19–32)
Calcium: 9.3 mg/dL (ref 8.4–10.5)
Chloride: 106 mEq/L (ref 96–112)
GFR: 80.04 mL/min (ref >60.00)
Glucose, Bld: 75 mg/dL (ref 70–99)
Potassium: 4.3 mEq/L (ref 3.5–5.1)

## 2013-01-01 LAB — TSH: TSH: 1.23 u[IU]/mL (ref 0.35–5.50)

## 2013-01-13 ENCOUNTER — Other Ambulatory Visit: Payer: Self-pay | Admitting: *Deleted

## 2013-01-13 MED ORDER — ESTRADIOL 1 MG PO TABS: 1.0000 mg | ORAL_TABLET | Freq: Every day | ORAL | Status: DC

## 2013-01-17 ENCOUNTER — Ambulatory Visit (INDEPENDENT_AMBULATORY_CARE_PROVIDER_SITE_OTHER): Payer: Medicare Other | Admitting: Cardiology

## 2013-01-17 DIAGNOSIS — R55 Syncope and collapse: Secondary | ICD-10-CM

## 2013-01-20 ENCOUNTER — Telehealth: Payer: Self-pay | Admitting: Internal Medicine

## 2013-01-20 NOTE — Telephone Encounter (Signed)
Please advise 

## 2013-01-20 NOTE — Telephone Encounter (Signed)
Pt had echo on 01/17/13. Please let patient know that once I review the results we will let her know.

## 2013-01-20 NOTE — Telephone Encounter (Signed)
Patient wanting results from her Echo Cardiogram.

## 2013-01-21 NOTE — Telephone Encounter (Signed)
raquel ordered it, not me.  i do not have it

## 2013-01-21 NOTE — Telephone Encounter (Signed)
Patient calling again for results.

## 2013-01-23 NOTE — Telephone Encounter (Signed)
Pt was notified on 01/21/13 of normal 2D Echo, left message on home machine with results. Called again and left message with patient to call back if she has any questions or persistence of symptoms.

## 2013-01-23 NOTE — Telephone Encounter (Signed)
Patient inquiring on voicemail about ECHO please advise.

## 2013-01-29 ENCOUNTER — Ambulatory Visit (INDEPENDENT_AMBULATORY_CARE_PROVIDER_SITE_OTHER): Payer: Medicare Other | Admitting: Internal Medicine

## 2013-01-29 ENCOUNTER — Encounter: Payer: Self-pay | Admitting: Internal Medicine

## 2013-01-29 VITALS — BP 142/60 | HR 65 | Temp 98.5°F | Resp 12 | Ht 64.0 in | Wt 147.0 lb

## 2013-01-29 DIAGNOSIS — IMO0001 Reserved for inherently not codable concepts without codable children: Secondary | ICD-10-CM

## 2013-01-29 DIAGNOSIS — IMO0002 Reserved for concepts with insufficient information to code with codable children: Secondary | ICD-10-CM

## 2013-01-29 DIAGNOSIS — J069 Acute upper respiratory infection, unspecified: Secondary | ICD-10-CM

## 2013-01-29 DIAGNOSIS — Z8739 Personal history of other diseases of the musculoskeletal system and connective tissue: Secondary | ICD-10-CM

## 2013-01-29 DIAGNOSIS — M25512 Pain in left shoulder: Secondary | ICD-10-CM

## 2013-01-29 DIAGNOSIS — M25519 Pain in unspecified shoulder: Secondary | ICD-10-CM

## 2013-01-29 DIAGNOSIS — R55 Syncope and collapse: Secondary | ICD-10-CM

## 2013-01-29 DIAGNOSIS — F4329 Adjustment disorder with other symptoms: Secondary | ICD-10-CM

## 2013-01-29 DIAGNOSIS — Z0000 Encounter for general adult medical examination without abnormal findings: Secondary | ICD-10-CM

## 2013-01-29 DIAGNOSIS — Z1239 Encounter for other screening for malignant neoplasm of breast: Secondary | ICD-10-CM

## 2013-01-29 DIAGNOSIS — M76899 Other specified enthesopathies of unspecified lower limb, excluding foot: Secondary | ICD-10-CM

## 2013-01-29 DIAGNOSIS — H9190 Unspecified hearing loss, unspecified ear: Secondary | ICD-10-CM

## 2013-01-29 DIAGNOSIS — E559 Vitamin D deficiency, unspecified: Secondary | ICD-10-CM

## 2013-01-29 DIAGNOSIS — Z23 Encounter for immunization: Secondary | ICD-10-CM

## 2013-01-29 DIAGNOSIS — M7070 Other bursitis of hip, unspecified hip: Secondary | ICD-10-CM | POA: Insufficient documentation

## 2013-01-29 DIAGNOSIS — M5417 Radiculopathy, lumbosacral region: Secondary | ICD-10-CM

## 2013-01-29 DIAGNOSIS — E785 Hyperlipidemia, unspecified: Secondary | ICD-10-CM

## 2013-01-29 DIAGNOSIS — F4321 Adjustment disorder with depressed mood: Secondary | ICD-10-CM

## 2013-01-29 DIAGNOSIS — K2270 Barrett's esophagus without dysplasia: Secondary | ICD-10-CM

## 2013-01-29 DIAGNOSIS — H9193 Unspecified hearing loss, bilateral: Secondary | ICD-10-CM

## 2013-01-29 DIAGNOSIS — M797 Fibromyalgia: Secondary | ICD-10-CM

## 2013-01-29 MED ORDER — DULOXETINE HCL 30 MG PO CPEP: 30.0000 mg | ORAL_CAPSULE | Freq: Every day | ORAL | Status: DC

## 2013-01-29 MED ORDER — TETANUS-DIPHTH-ACELL PERTUSSIS 5-2.5-18.5 LF-MCG/0.5 IM SUSP: 0.5000 mL | Freq: Once | INTRAMUSCULAR | Status: DC

## 2013-01-29 NOTE — Assessment & Plan Note (Addendum)
Her pain is complicated and involves both legs, and has not responded to Lyrica due to intolerance. Trial of Cymbalta and rule out spinal stenosis.

## 2013-01-29 NOTE — Progress Notes (Signed)
Patient ID: Colleen Cooper, female   DOB: 15-Apr-1941, 72 y.o.   MRN: [id number]   The patient is here for annual Medicare wellness examination and management of other chronic and acute problems.  She was seen last week by RR ,  NP for evaluation due to a recent presyncopal episode which occurred while drinking coffee at home.  Because it was her second incident in 4 months she was sent for ECHO ,  Which was normal.  Neither incident actully involved LOC, chest pain or  Palpitations.  She has been under significant emotional stress for the last year due to her husband's declining health followed by his death in 09/10/22.   she has been surrounded by well meaning family and friends but is tired of the tension and wants to be left alone. She is in constant pain from prior orthopedic fracture as well as her fibromyalgia. She has pain that involves both legs from the hips down to the ankles that she describes as stinging and burning. She does not have any significant low back pain but states it hurts to pick up her feet so she walks very slowly. Her left shoulder continues to give her constant pain. She has a history of left shoulder fracture which occurred after falling several months ago. She did not have surgery for or so, but did not go to physical therapy because of her husband's health and now has limited range of motion with the shoulder .  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 I do not have records from the allergist or the orthopedist.      The risk factors are reflected in the social history.  The roster of all physicians providing medical care to patient - is listed in the Snapshot section of the chart.  Activities of daily living:  The patient is 100% independent in all ADLs: dressing, toileting, feeding as well as independent mobility  Home safety : The patient has smoke detectors in the home. They  wear seatbelts.  There are no firearms at home. There is no violence in the home.   There is no risks for hepatitis, STDs or HIV. There is no   history of blood transfusion. They have no travel history to infectious disease endemic areas of the world.  The patient has not  seen their dentist in the last six month. They have not een their eye doctor in the last year. They admit to slight hearing difficulty with regard to whispered voices and some television programs.  They have deferred audiologic testing in the last year but is now requesting it.   They do not  have excessive sun exposure. Discussed the need for sun protection: hats, long sleeves and use of sunscreen if there is significant sun exposure.   Diet: the importance of a healthy diet is discussed. She has a poor diet due to allergy to "mammal proteins" and anorexia /depressive symptoms   The benefits of regular aerobic exercise were discussed. She does not exercise due to pain .   Depression screen: there are multiple  signs  of depression- irritability, change in appetite, anhedonia, sadness/tearfullness.  Cognitive assessment: the patient manages all their financial and personal affairs and is actively engaged. They could relate day,date,year and events; she could recall 2/3 objects at 3 minutes; performed clock-face test normally. she is worried that she is developing dementia since her mother  had dementia. The following portions of the patient's history were reviewed and updated as appropriate: allergies, current medications, past family history, past medical history,  past surgical history, past social history  and problem list.  Visual acuity was not assessed per patient preference since she has regular follow up with her ophthalmologist. Hearing and body mass index were assessed and reviewed.   During the course of the visit the patient was educated and counseled about appropriate screening and preventive services including : fall  prevention , diabetes screening, nutrition counseling, colorectal cancer screening, and recommended immunizations.    Objective:   BP 142/60  Pulse 65  Temp(Src) 98.5 F (36.9 C) (Oral)  Resp 12  Ht 5\' 4"  (1.626 m)  Wt 147 lb (66.679 kg)  BMI 25.22 kg/m2  SpO2 99%  General appearance: depressed, tearful,  alert, cooperative and appears stated age Ears: normal TM's and external ear canals both ears Throat: lips, mucosa, and tongue normal; teeth and gums normal Neck: no adenopathy, no carotid bruit, supple, symmetrical, trachea midline and thyroid not enlarged, symmetric, no tenderness/mass/nodules Back: symmetric, no curvature. ROM normal. No CVA tenderness. Lungs: clear to auscultation bilaterally Heart: regular rate and rhythm, S1, S2 normal, no murmur, click, rub or gallop Abdomen: soft, non-tender; bowel sounds normal; no masses,  no organomegaly Pulses: 2+ and symmetric Skin: Skin color, texture, turgor normal. No rashes or lesions Lymph nodes: Cervical, supraclavicular, and axillary nodes normal.  Assessment and Plan:  Severe pain of left shoulder Left shoulder,  Fractured jan 2014.  [facility] did not rec sugery,but PT was never done bc husband was dying .   Fibromyalgia syndrome Her pain is complicated and involves both legs, and has not responded to Lyrica due to intolerance. Trial of Cymbalta and rule out spinal stenosis.   Bursitis of hip She was Treated with steroids injections by Dr.Chesnis with transient improvement. She is Tender to minimal palpation in the lateral thigh, an area well below the joint. Records requested.  Complicated grief Patient is clearly depressed as a result of the loss of her husband in March. She is in part constant pain due to fibromyalgia versus on diagnosed spinal stenosis. I have recommended a trial of Cymbalta as well as an MRI of her lumbar spine.  I have recommended she go to grief counseling she's not interested. I will have her  return in one month.  History of back pain Given her bilateral leg pain and concern that she has developed spinal stenosis. She has not had an MRI of the lumbar spine in over 7 years. I'm ordering this today  Near syncope Echocardiogram was normal. She has not had a syncopal episode so referral for Holter monitor was discussed as a future recourse if she actually has a syncopal episode. Symptoms were treated to dehydration emotional stress and pain.  Barrett's esophagus Despite prior recommendations to resume PPI she continues to use fish oil for management of reflux  Routine general medical examination at a health care facility Annual comprehensive exam was done including breast,exam was done . All screenings have been addressed .    A total of 60 minutes was spent with patient more than half of which was spent in counseling, reviewing records from other prviders and coordination of care.  Updated Medication List Outpatient Encounter Prescriptions as of 01/29/2013  Medication Sig Dispense Refill  . cyclobenzaprine (FLEXERIL) 5 MG tablet Take 1 tablet (5 mg total) by mouth 3 (three) times daily as needed for muscle  spasms.  60 tablet  2  . estradiol (ESTRACE) 1 MG tablet Take 1 tablet (1 mg total) by mouth daily.  90 tablet  1  . fish oil-omega-3 fatty acids 1000 MG capsule Take 2 g by mouth daily.      . traMADol (ULTRAM) 50 MG tablet Take 1 tablet (50 mg total) by mouth every 8 (eight) hours as needed for pain.  90 tablet  5  . DULoxetine (CYMBALTA) 30 MG capsule Take 1 capsule (30 mg total) by mouth daily.  30 capsule  3  . fluticasone (FLONASE) 50 MCG/ACT nasal spray Place 2 sprays into the nose daily.  16 g  0  . Magnesium 100 MG CAPS Take by mouth.      . TDaP (BOOSTRIX) 5-2.5-18.5 LF-MCG/0.5 injection Inject 0.5 mLs into the muscle once.  0.5 mL  0   No facility-administered encounter medications on file as of 01/29/2013.

## 2013-01-29 NOTE — Assessment & Plan Note (Addendum)
She was Treated with steroids injections by Dr.Chesnis with transient improvement. She is Tender to minimal palpation in the lateral thigh, an area well below the joint. Records requested.

## 2013-01-29 NOTE — Assessment & Plan Note (Signed)
Left shoulder,  Fractured jan 2014.  [facility] did not rec sugery,but PT was never done bc husband was dying .

## 2013-01-29 NOTE — Patient Instructions (Signed)
I am recommending a trial of cymbalta for your depression and chronic pain  Please return in one month   You had your annual Medicare wellness exam today  We will schedule your mammogram soon.   You need to have a TDaP vaccine   I have given you prescriptions because it be cheaper at the health Dept or at your  local pharmacy because Medicare will not reimburse for them.   You received the pneumonia vaccine today.  We will contact you with the bloodwork results  DEXA scan,   Hearing test,  And MRI of lumbar spine to be scheduled

## 2013-01-30 ENCOUNTER — Telehealth: Payer: Self-pay | Admitting: Internal Medicine

## 2013-01-30 DIAGNOSIS — Z0000 Encounter for general adult medical examination without abnormal findings: Secondary | ICD-10-CM | POA: Insufficient documentation

## 2013-01-30 LAB — VITAMIN D 25 HYDROXY (VIT D DEFICIENCY, FRACTURES): Vit D, 25-Hydroxy: 38 ng/mL (ref 30–89)

## 2013-01-30 MED ORDER — CITALOPRAM HYDROBROMIDE 10 MG PO TABS: 10.0000 mg | ORAL_TABLET | Freq: Every day | ORAL | Status: DC

## 2013-01-30 NOTE — Assessment & Plan Note (Signed)
Annual comprehensive exam was done including breast,exam was done . All screenings have been addressed .

## 2013-01-30 NOTE — Assessment & Plan Note (Signed)
Despite prior recommendations to resume PPI she continues to use fish oil for management of reflux

## 2013-01-30 NOTE — Telephone Encounter (Signed)
Patient notified of the assistance program and patient stated she will call and initiate the program. Thanks

## 2013-01-30 NOTE — Assessment & Plan Note (Signed)
Echocardiogram was normal. She has not had a syncopal episode so referral for Holter monitor was discussed as a future recourse if she actually has a syncopal episode. Symptoms were treated to dehydration emotional stress and pain.

## 2013-01-30 NOTE — Telephone Encounter (Signed)
There is a program called [employer]  That makes cymbalta there number is [phone]). That can help with cost if patient cannot use an alternative and if she qualifies it can be of little or no cost.

## 2013-01-30 NOTE — Telephone Encounter (Signed)
i chose cymbalta because of her chronic pain and concurrent depression.  If she would like to apply for the assistance program, we  Can put her on an alternative antidepressant while she does the application. I will send an alternative to her pharmacy if you will give her the infor for the [place] assist program

## 2013-01-30 NOTE — Assessment & Plan Note (Signed)
Given her bilateral leg pain and concern that she has developed spinal stenosis. She has not had an MRI of the lumbar spine in over 7 years. I'm ordering this today

## 2013-01-30 NOTE — Assessment & Plan Note (Signed)
Patient is clearly depressed as a result of the loss of her husband in March. She is in part constant pain due to fibromyalgia versus on diagnosed spinal stenosis. I have recommended a trial of Cymbalta as well as an MRI of her lumbar spine.  I have recommended she go to grief counseling she's not interested. I will have her return in one month.

## 2013-01-30 NOTE — Telephone Encounter (Signed)
Patient called stating she went to the pharmacy to p/u her cymbalta. Patient states this was nearly 50.00. Is there another cheaper option?

## 2013-01-30 NOTE — Telephone Encounter (Signed)
Pharmacy called and stated there is an enter action potential between Tramadol and citalopram and are you aware would like them to continue script.

## 2013-01-30 NOTE — Telephone Encounter (Signed)
Yes i am aware,  i get an automatic pop up every time i try to order it.,  Continue script.

## 2013-01-31 ENCOUNTER — Encounter: Payer: Self-pay | Admitting: *Deleted

## 2013-01-31 NOTE — Telephone Encounter (Signed)
Notified pharmacy to continue script.

## 2013-02-01 ENCOUNTER — Ambulatory Visit: Payer: Self-pay | Admitting: Internal Medicine

## 2013-02-03 ENCOUNTER — Telehealth: Payer: Self-pay | Admitting: Internal Medicine

## 2013-02-03 NOTE — Telephone Encounter (Signed)
Pt also checking on mri results that was done 02/01/13 @ [facility]

## 2013-02-03 NOTE — Telephone Encounter (Signed)
Pt came in today stating her daughter gave her compounded cream for her to try for her arm and leg pain.  Pt stated she has tried this several times and this seems to help her pain.  Pt wanted to know if you could write and rx for this  Please send to [facility]   Keta/baclo/cyclo/diclo/gaba/tetra [place] 10/2/2/3/6/2% cream Apply 1 to 2 grams (pumps0 topically to  Affected area and thoroughly rub in 3 to 4 times daily  Max 8 pumps per day. This was done at [facility] compound[facility] center [address] [place], [address] Phone # [phone]

## 2013-02-04 ENCOUNTER — Telehealth: Payer: Self-pay | Admitting: Internal Medicine

## 2013-02-04 DIAGNOSIS — Z8739 Personal history of other diseases of the musculoskeletal system and connective tissue: Secondary | ICD-10-CM

## 2013-02-04 DIAGNOSIS — M549 Dorsalgia, unspecified: Secondary | ICD-10-CM

## 2013-02-04 NOTE — Telephone Encounter (Signed)
MRI showed one minimally bulging disk  No nerve root problems.  recommend referral to Pain clinic if she is willing to proceed.

## 2013-02-04 NOTE — Telephone Encounter (Signed)
Patient notified of results as requested and voiced understanding. Patient did say she is not willing to try pain clinic yet she has a cream her daughter gave her that is helping and she would like a script patient is to call and get name of cream and notify us of product name. Patient wanted to know would you be willing to write script for this medication? Informed patient that would depend on the medication.

## 2013-02-04 NOTE — Telephone Encounter (Signed)
.    If she can get me the name Ill look into it

## 2013-02-04 NOTE — Telephone Encounter (Signed)
[phone] \ [phone] [facility].

## 2013-02-06 NOTE — Telephone Encounter (Signed)
Patient is to bring by list of ingredients in compound.

## 2013-02-06 NOTE — Telephone Encounter (Signed)
Ask patient to return my call and left message at number given for pharmacy to return my call.

## 2013-02-06 NOTE — Telephone Encounter (Signed)
Still no results of MRI in chart.

## 2013-02-10 MED ORDER — FLURBIPROFEN-BACLOFEN-LIDO 15-4-5 % EX CREA: 1.0000 "application " | TOPICAL_CREAM | CUTANEOUS | Status: DC

## 2013-02-10 NOTE — Telephone Encounter (Signed)
Called [facility] and they are to get back in touch with me to sent a script for medication.

## 2013-02-10 NOTE — Telephone Encounter (Signed)
The compounded cream that she has requested may be available from the company you listed below.  The ingredients have to be listed with regard to volume and since there is no prescription for it in [facility] I cannot order it.   If the company listed is able to send me a prescription for it I will sign it    Otherwise she can try the one i printed out that has a  Few but not all of the ingredients she listed.

## 2013-02-10 NOTE — Telephone Encounter (Signed)
I have not seen results on this patient MRI have you received anything.

## 2013-02-10 NOTE — Telephone Encounter (Signed)
She has a minimal disk bulge at the L2-L3 space,  No nerve root contact.  Colleen Cooper I see a different message that actually has the proportions in the message so I will hand write the rx when I get a chance this afternoon

## 2013-02-10 NOTE — Telephone Encounter (Signed)
List of ingredients in compound are as follows, Ketamine, Baclosen, Cyclobenzaprine, Diclofenac, Gabapentin, Tetracaine, States similar to valtaren but less expensive. Patient would like script for this ointment in preference to being referred to pain clinic. I have phone number to call in please advise.

## 2013-02-11 ENCOUNTER — Encounter: Payer: Self-pay | Admitting: Internal Medicine

## 2013-02-11 NOTE — Telephone Encounter (Signed)
Left message for patient to return call.

## 2013-02-12 NOTE — Telephone Encounter (Signed)
Pt returned your call Please call asap

## 2013-02-13 NOTE — Telephone Encounter (Signed)
Script signed and faxed back to [facility] compounding center.

## 2013-02-13 NOTE — Telephone Encounter (Signed)
Script faxed and in red folder.

## 2013-02-13 NOTE — Telephone Encounter (Signed)
Jessica from [facility], is to send script attention Olegario Messier to day with the ingredients listed patient wants script sent back to [facility].

## 2013-07-14 ENCOUNTER — Emergency Department: Payer: Self-pay | Admitting: Emergency Medicine

## 2013-07-14 LAB — CBC WITH DIFFERENTIAL/PLATELET
BASOS PCT: 0.7 %
Basophil #: 0.1 10*3/uL (ref 0.0–0.1)
EOS ABS: 0.2 10*3/uL (ref 0.0–0.7)
Eosinophil %: 1.8 %
HCT: 40.7 % (ref 35.0–47.0)
HGB: 13.6 g/dL (ref 12.0–16.0)
Lymphocyte #: 2.1 10*3/uL (ref 1.0–3.6)
Lymphocyte %: 17.6 %
MCH: 32 pg (ref 26.0–34.0)
MCHC: 33.3 g/dL (ref 32.0–36.0)
MCV: 96 fL (ref 80–100)
MONOS PCT: 7.3 %
Monocyte #: 0.9 x10 3/mm (ref 0.2–0.9)
NEUTROS PCT: 72.6 %
Neutrophil #: 8.5 10*3/uL — ABNORMAL HIGH (ref 1.4–6.5)
PLATELETS: 203 10*3/uL (ref 150–440)
RBC: 4.24 10*6/uL (ref 3.80–5.20)
RDW: 13.8 % (ref 11.5–14.5)
WBC: 11.7 10*3/uL — ABNORMAL HIGH (ref 3.6–11.0)

## 2013-07-14 LAB — COMPREHENSIVE METABOLIC PANEL
ALBUMIN: 3.5 g/dL (ref 3.4–5.0)
ANION GAP: 1 — AB (ref 7–16)
Alkaline Phosphatase: 83 U/L
BUN: 25 mg/dL — AB (ref 7–18)
Bilirubin,Total: 0.7 mg/dL (ref 0.2–1.0)
Calcium, Total: 8.9 mg/dL (ref 8.5–10.1)
Chloride: 103 mmol/L (ref 98–107)
Co2: 33 mmol/L — ABNORMAL HIGH (ref 21–32)
Creatinine: 0.88 mg/dL (ref 0.60–1.30)
EGFR (African American): >60
GLUCOSE: 90 mg/dL (ref 65–99)
Osmolality: 278 (ref 275–301)
POTASSIUM: 4.2 mmol/L (ref 3.5–5.1)
SGOT(AST): 25 U/L (ref 15–37)
SGPT (ALT): 37 U/L (ref 12–78)
Sodium: 137 mmol/L (ref 136–145)
Total Protein: 6.4 g/dL (ref 6.4–8.2)

## 2013-07-14 LAB — LIPASE, BLOOD: LIPASE: 199 U/L (ref 73–393)

## 2013-07-14 LAB — URINALYSIS, COMPLETE
BACTERIA: NONE SEEN
Bilirubin,UR: NEGATIVE
Blood: NEGATIVE
Glucose,UR: NEGATIVE mg/dL (ref 0–75)
KETONE: NEGATIVE
Leukocyte Esterase: NEGATIVE
Nitrite: NEGATIVE
Ph: 6 (ref 4.5–8.0)
Protein: NEGATIVE
Specific Gravity: 1.02 (ref 1.003–1.030)
Squamous Epithelial: 1
WBC UR: <1 /HPF (ref 0–5)

## 2013-07-14 LAB — TROPONIN I: Troponin-I: <0.02 ng/mL

## 2013-07-18 ENCOUNTER — Ambulatory Visit: Payer: Self-pay | Admitting: Gastroenterology

## 2013-07-24 ENCOUNTER — Ambulatory Visit: Payer: Self-pay | Admitting: Family Medicine

## 2013-07-29 ENCOUNTER — Ambulatory Visit: Payer: Self-pay | Admitting: Family Medicine

## 2013-08-05 ENCOUNTER — Ambulatory Visit: Payer: Self-pay | Admitting: Family Medicine

## 2013-08-06 ENCOUNTER — Ambulatory Visit: Payer: Self-pay | Admitting: Family Medicine

## 2013-09-22 ENCOUNTER — Ambulatory Visit: Payer: Self-pay | Admitting: Vascular Surgery

## 2013-09-22 LAB — BASIC METABOLIC PANEL
ANION GAP: 4 — AB (ref 7–16)
BUN: 15 mg/dL (ref 7–18)
CALCIUM: 8.9 mg/dL (ref 8.5–10.1)
Chloride: 104 mmol/L (ref 98–107)
Co2: 31 mmol/L (ref 21–32)
Creatinine: 0.91 mg/dL (ref 0.60–1.30)
EGFR (African American): >60
EGFR (Non-African Amer.): >60
GLUCOSE: 83 mg/dL (ref 65–99)
OSMOLALITY: 278 (ref 275–301)
Potassium: 3.7 mmol/L (ref 3.5–5.1)
Sodium: 139 mmol/L (ref 136–145)

## 2013-11-03 DIAGNOSIS — M545 Low back pain, unspecified: Secondary | ICD-10-CM | POA: Insufficient documentation

## 2013-11-03 DIAGNOSIS — M7060 Trochanteric bursitis, unspecified hip: Secondary | ICD-10-CM | POA: Insufficient documentation

## 2013-11-03 DIAGNOSIS — M25569 Pain in unspecified knee: Secondary | ICD-10-CM | POA: Insufficient documentation

## 2014-06-26 ENCOUNTER — Emergency Department: Payer: Self-pay | Admitting: Emergency Medicine

## 2014-06-26 DIAGNOSIS — F329 Major depressive disorder, single episode, unspecified: Secondary | ICD-10-CM | POA: Diagnosis not present

## 2014-06-26 DIAGNOSIS — R51 Headache: Secondary | ICD-10-CM | POA: Diagnosis not present

## 2014-06-26 DIAGNOSIS — M25473 Effusion, unspecified ankle: Secondary | ICD-10-CM | POA: Diagnosis not present

## 2014-06-26 DIAGNOSIS — R61 Generalized hyperhidrosis: Secondary | ICD-10-CM | POA: Diagnosis not present

## 2014-06-26 DIAGNOSIS — F419 Anxiety disorder, unspecified: Secondary | ICD-10-CM | POA: Diagnosis not present

## 2014-06-26 DIAGNOSIS — R509 Fever, unspecified: Secondary | ICD-10-CM | POA: Diagnosis not present

## 2014-06-26 DIAGNOSIS — I10 Essential (primary) hypertension: Secondary | ICD-10-CM | POA: Diagnosis not present

## 2014-06-26 DIAGNOSIS — R05 Cough: Secondary | ICD-10-CM | POA: Diagnosis not present

## 2014-06-26 LAB — URINALYSIS, COMPLETE
BILIRUBIN, UR: NEGATIVE
GLUCOSE, UR: NEGATIVE mg/dL (ref 0–75)
Hyaline Cast: 2
Nitrite: NEGATIVE
PH: 5 (ref 4.5–8.0)
PROTEIN: NEGATIVE
RBC,UR: 9 /HPF (ref 0–5)
Specific Gravity: 1.018 (ref 1.003–1.030)
Squamous Epithelial: 1
WBC UR: 11 /HPF (ref 0–5)

## 2014-06-26 LAB — CBC WITH DIFFERENTIAL/PLATELET
BASOS PCT: 0.3 %
Basophil #: 0 10*3/uL (ref 0.0–0.1)
EOS PCT: 0 %
Eosinophil #: 0 10*3/uL (ref 0.0–0.7)
HCT: 37.4 % (ref 35.0–47.0)
HGB: 13 g/dL (ref 12.0–16.0)
LYMPHS ABS: 1 10*3/uL (ref 1.0–3.6)
Lymphocyte %: 14.4 %
MCH: 32 pg (ref 26.0–34.0)
MCHC: 34.8 g/dL (ref 32.0–36.0)
MCV: 92 fL (ref 80–100)
Monocyte #: 0.6 x10 3/mm (ref 0.2–0.9)
Monocyte %: 8.2 %
Neutrophil #: 5.3 10*3/uL (ref 1.4–6.5)
Neutrophil %: 77.1 %
Platelet: 141 10*3/uL — ABNORMAL LOW (ref 150–440)
RBC: 4.08 10*6/uL (ref 3.80–5.20)
RDW: 13.6 % (ref 11.5–14.5)
WBC: 6.9 10*3/uL (ref 3.6–11.0)

## 2014-06-26 LAB — ED INFLUENZA
H1N1 flu by pcr: NOT DETECTED
INFLAPCR: NEGATIVE
Influenza B By PCR: NEGATIVE

## 2014-06-26 LAB — BASIC METABOLIC PANEL
ANION GAP: 7 (ref 7–16)
BUN: 13 mg/dL (ref 7–18)
CALCIUM: 8.6 mg/dL (ref 8.5–10.1)
CHLORIDE: 102 mmol/L (ref 98–107)
CREATININE: 1.01 mg/dL (ref 0.60–1.30)
Co2: 28 mmol/L (ref 21–32)
GFR CALC NON AF AMER: 57 — AB
Glucose: 102 mg/dL — ABNORMAL HIGH (ref 65–99)
Osmolality: 274 (ref 275–301)
Potassium: 3.7 mmol/L (ref 3.5–5.1)
Sodium: 137 mmol/L (ref 136–145)

## 2014-06-26 LAB — TROPONIN I: Troponin-I: <0.02 ng/mL

## 2014-06-28 ENCOUNTER — Emergency Department: Payer: Self-pay | Admitting: Emergency Medicine

## 2014-06-28 DIAGNOSIS — R509 Fever, unspecified: Secondary | ICD-10-CM | POA: Diagnosis not present

## 2014-06-28 DIAGNOSIS — B349 Viral infection, unspecified: Secondary | ICD-10-CM | POA: Diagnosis not present

## 2014-06-28 DIAGNOSIS — J069 Acute upper respiratory infection, unspecified: Secondary | ICD-10-CM | POA: Diagnosis not present

## 2014-06-28 DIAGNOSIS — M791 Myalgia: Secondary | ICD-10-CM | POA: Diagnosis not present

## 2014-06-28 DIAGNOSIS — R531 Weakness: Secondary | ICD-10-CM | POA: Diagnosis not present

## 2014-06-28 DIAGNOSIS — R05 Cough: Secondary | ICD-10-CM | POA: Diagnosis not present

## 2014-06-28 DIAGNOSIS — R52 Pain, unspecified: Secondary | ICD-10-CM | POA: Diagnosis not present

## 2014-06-28 LAB — URINALYSIS, COMPLETE
Bilirubin,UR: NEGATIVE
Glucose,UR: NEGATIVE mg/dL (ref 0–75)
Ketone: NEGATIVE
Leukocyte Esterase: NEGATIVE
Nitrite: NEGATIVE
PH: 6 (ref 4.5–8.0)
PROTEIN: NEGATIVE
RBC,UR: 1 /HPF (ref 0–5)
SPECIFIC GRAVITY: 1.003 (ref 1.003–1.030)
Squamous Epithelial: NONE SEEN
WBC UR: <1 /HPF (ref 0–5)

## 2014-06-28 LAB — BASIC METABOLIC PANEL
Anion Gap: 9 (ref 7–16)
BUN: 12 mg/dL (ref 7–18)
Calcium, Total: 7.8 mg/dL — ABNORMAL LOW (ref 8.5–10.1)
Chloride: 100 mmol/L (ref 98–107)
Co2: 24 mmol/L (ref 21–32)
Creatinine: 1.1 mg/dL (ref 0.60–1.30)
EGFR (African American): >60
EGFR (Non-African Amer.): 52 — ABNORMAL LOW
Glucose: 93 mg/dL (ref 65–99)
Osmolality: 266 (ref 275–301)
Potassium: 3.3 mmol/L — ABNORMAL LOW (ref 3.5–5.1)
Sodium: 133 mmol/L — ABNORMAL LOW (ref 136–145)

## 2014-06-28 LAB — CBC WITH DIFFERENTIAL/PLATELET
BASOS PCT: 0.4 %
Basophil #: 0 10*3/uL (ref 0.0–0.1)
EOS PCT: 0.1 %
Eosinophil #: 0 10*3/uL (ref 0.0–0.7)
HCT: 32.7 % — AB (ref 35.0–47.0)
HGB: 11 g/dL — AB (ref 12.0–16.0)
LYMPHS ABS: 0.9 10*3/uL — AB (ref 1.0–3.6)
Lymphocyte %: 18.2 %
MCH: 31.3 pg (ref 26.0–34.0)
MCHC: 33.7 g/dL (ref 32.0–36.0)
MCV: 93 fL (ref 80–100)
MONO ABS: 0.4 x10 3/mm (ref 0.2–0.9)
MONOS PCT: 8.4 %
NEUTROS ABS: 3.5 10*3/uL (ref 1.4–6.5)
Neutrophil %: 72.9 %
Platelet: 108 10*3/uL — ABNORMAL LOW (ref 150–440)
RBC: 3.53 10*6/uL — AB (ref 3.80–5.20)
RDW: 13.5 % (ref 11.5–14.5)
WBC: 4.8 10*3/uL (ref 3.6–11.0)

## 2014-06-28 LAB — TROPONIN I

## 2014-06-30 LAB — URINE CULTURE

## 2014-07-02 ENCOUNTER — Encounter (HOSPITAL_COMMUNITY): Payer: Self-pay | Admitting: *Deleted

## 2014-07-02 ENCOUNTER — Inpatient Hospital Stay (HOSPITAL_COMMUNITY)
Admission: EM | Admit: 2014-07-02 | Discharge: 2014-07-06 | DRG: 871 | Disposition: A | Discharge status: Home / Self Care | Payer: Medicare Other | Attending: Internal Medicine | Admitting: Internal Medicine

## 2014-07-02 ENCOUNTER — Emergency Department (HOSPITAL_COMMUNITY): Payer: Medicare Other

## 2014-07-02 DIAGNOSIS — L308 Other specified dermatitis: Secondary | ICD-10-CM

## 2014-07-02 DIAGNOSIS — J9601 Acute respiratory failure with hypoxia: Secondary | ICD-10-CM | POA: Diagnosis not present

## 2014-07-02 DIAGNOSIS — K7200 Acute and subacute hepatic failure without coma: Secondary | ICD-10-CM | POA: Diagnosis present

## 2014-07-02 DIAGNOSIS — A419 Sepsis, unspecified organism: Secondary | ICD-10-CM | POA: Diagnosis not present

## 2014-07-02 DIAGNOSIS — Z87891 Personal history of nicotine dependence: Secondary | ICD-10-CM

## 2014-07-02 DIAGNOSIS — R059 Cough, unspecified: Secondary | ICD-10-CM

## 2014-07-02 DIAGNOSIS — Z7951 Long term (current) use of inhaled steroids: Secondary | ICD-10-CM | POA: Diagnosis not present

## 2014-07-02 DIAGNOSIS — R740 Nonspecific elevation of levels of transaminase and lactic acid dehydrogenase [LDH]: Secondary | ICD-10-CM

## 2014-07-02 DIAGNOSIS — R05 Cough: Secondary | ICD-10-CM

## 2014-07-02 DIAGNOSIS — B028 Zoster with other complications: Secondary | ICD-10-CM | POA: Diagnosis not present

## 2014-07-02 DIAGNOSIS — R7401 Elevation of levels of liver transaminase levels: Secondary | ICD-10-CM | POA: Diagnosis present

## 2014-07-02 DIAGNOSIS — R748 Abnormal levels of other serum enzymes: Secondary | ICD-10-CM

## 2014-07-02 DIAGNOSIS — IMO0002 Reserved for concepts with insufficient information to code with codable children: Secondary | ICD-10-CM | POA: Insufficient documentation

## 2014-07-02 DIAGNOSIS — R799 Abnormal finding of blood chemistry, unspecified: Secondary | ICD-10-CM | POA: Diagnosis not present

## 2014-07-02 DIAGNOSIS — M797 Fibromyalgia: Secondary | ICD-10-CM | POA: Diagnosis present

## 2014-07-02 DIAGNOSIS — J9600 Acute respiratory failure, unspecified whether with hypoxia or hypercapnia: Secondary | ICD-10-CM | POA: Diagnosis not present

## 2014-07-02 DIAGNOSIS — D72819 Decreased white blood cell count, unspecified: Secondary | ICD-10-CM | POA: Diagnosis not present

## 2014-07-02 DIAGNOSIS — Z90710 Acquired absence of both cervix and uterus: Secondary | ICD-10-CM | POA: Diagnosis not present

## 2014-07-02 DIAGNOSIS — R51 Headache: Secondary | ICD-10-CM | POA: Diagnosis not present

## 2014-07-02 DIAGNOSIS — K2270 Barrett's esophagus without dysplasia: Secondary | ICD-10-CM | POA: Diagnosis present

## 2014-07-02 DIAGNOSIS — R0789 Other chest pain: Secondary | ICD-10-CM | POA: Diagnosis present

## 2014-07-02 DIAGNOSIS — R945 Abnormal results of liver function studies: Secondary | ICD-10-CM | POA: Diagnosis not present

## 2014-07-02 DIAGNOSIS — M79609 Pain in unspecified limb: Secondary | ICD-10-CM | POA: Diagnosis not present

## 2014-07-02 DIAGNOSIS — K760 Fatty (change of) liver, not elsewhere classified: Secondary | ICD-10-CM | POA: Diagnosis not present

## 2014-07-02 DIAGNOSIS — J189 Pneumonia, unspecified organism: Secondary | ICD-10-CM | POA: Diagnosis not present

## 2014-07-02 DIAGNOSIS — R197 Diarrhea, unspecified: Secondary | ICD-10-CM | POA: Diagnosis not present

## 2014-07-02 DIAGNOSIS — K8020 Calculus of gallbladder without cholecystitis without obstruction: Secondary | ICD-10-CM | POA: Diagnosis not present

## 2014-07-02 DIAGNOSIS — R918 Other nonspecific abnormal finding of lung field: Secondary | ICD-10-CM | POA: Diagnosis not present

## 2014-07-02 DIAGNOSIS — R509 Fever, unspecified: Secondary | ICD-10-CM | POA: Diagnosis not present

## 2014-07-02 DIAGNOSIS — R0602 Shortness of breath: Secondary | ICD-10-CM | POA: Diagnosis not present

## 2014-07-02 DIAGNOSIS — R609 Edema, unspecified: Secondary | ICD-10-CM | POA: Diagnosis not present

## 2014-07-02 LAB — COMPREHENSIVE METABOLIC PANEL
ALT: 220 U/L — AB (ref 0–35)
AST: 383 U/L — ABNORMAL HIGH (ref 0–37)
Albumin: 3 g/dL — ABNORMAL LOW (ref 3.5–5.2)
Alkaline Phosphatase: 89 U/L (ref 39–117)
Anion gap: 16 — ABNORMAL HIGH (ref 5–15)
BUN: 10 mg/dL (ref 6–23)
CALCIUM: 8.4 mg/dL (ref 8.4–10.5)
CO2: 19 mmol/L (ref 19–32)
CREATININE: 1.05 mg/dL (ref 0.50–1.10)
Chloride: 94 mEq/L — ABNORMAL LOW (ref 96–112)
GFR, EST AFRICAN AMERICAN: 60 mL/min — AB (ref >90)
GFR, EST NON AFRICAN AMERICAN: 52 mL/min — AB (ref >90)
GLUCOSE: 99 mg/dL (ref 70–99)
Potassium: 3.3 mmol/L — ABNORMAL LOW (ref 3.5–5.1)
Sodium: 129 mmol/L — ABNORMAL LOW (ref 135–145)
Total Bilirubin: 1.1 mg/dL (ref 0.3–1.2)
Total Protein: 6.4 g/dL (ref 6.0–8.3)

## 2014-07-02 LAB — CBC WITH DIFFERENTIAL/PLATELET
BASOS ABS: 0 10*3/uL (ref 0.0–0.1)
BASOS PCT: 0 % (ref 0–1)
Eosinophils Absolute: 0 10*3/uL (ref 0.0–0.7)
Eosinophils Relative: 0 % (ref 0–5)
HEMATOCRIT: 31.9 % — AB (ref 36.0–46.0)
HEMOGLOBIN: 11.2 g/dL — AB (ref 12.0–15.0)
LYMPHS PCT: 15 % (ref 12–46)
Lymphs Abs: 0.5 10*3/uL — ABNORMAL LOW (ref 0.7–4.0)
MCH: 30 pg (ref 26.0–34.0)
MCHC: 35.1 g/dL (ref 30.0–36.0)
MCV: 85.5 fL (ref 78.0–100.0)
MONO ABS: 0.3 10*3/uL (ref 0.1–1.0)
Monocytes Relative: 8 % (ref 3–12)
NEUTROS ABS: 2.8 10*3/uL (ref 1.7–7.7)
Neutrophils Relative %: 77 % (ref 43–77)
Platelets: 197 10*3/uL (ref 150–400)
RBC: 3.73 MIL/uL — ABNORMAL LOW (ref 3.87–5.11)
RDW: 13.3 % (ref 11.5–15.5)
WBC: 3.6 10*3/uL — AB (ref 4.0–10.5)

## 2014-07-02 LAB — URINALYSIS, ROUTINE W REFLEX MICROSCOPIC
GLUCOSE, UA: NEGATIVE mg/dL
Hgb urine dipstick: NEGATIVE
Ketones, ur: 15 mg/dL — AB
LEUKOCYTES UA: NEGATIVE
Nitrite: NEGATIVE
PH: 6 (ref 5.0–8.0)
Protein, ur: 30 mg/dL — AB
SPECIFIC GRAVITY, URINE: 1.015 (ref 1.005–1.030)
Urobilinogen, UA: 1 mg/dL (ref 0.0–1.0)

## 2014-07-02 LAB — LIPASE, BLOOD: LIPASE: 46 U/L (ref 11–59)

## 2014-07-02 LAB — URINE MICROSCOPIC-ADD ON

## 2014-07-02 MED ORDER — VANCOMYCIN HCL 500 MG IV SOLR
500.0000 mg | Freq: Two times a day (BID) | INTRAVENOUS | Status: DC
  Administered 2014-07-03 – 2014-07-04 (×3): 500 mg via INTRAVENOUS
  Filled 2014-07-02 (×5): qty 500

## 2014-07-02 MED ORDER — KETOROLAC TROMETHAMINE 30 MG/ML IJ SOLN
30.0000 mg | Freq: Once | INTRAMUSCULAR | Status: AC
  Administered 2014-07-02: 30 mg via INTRAVENOUS
  Filled 2014-07-02: qty 1

## 2014-07-02 MED ORDER — ACYCLOVIR 200 MG PO CAPS
800.0000 mg | ORAL_CAPSULE | Freq: Once | ORAL | Status: AC
  Administered 2014-07-02: 800 mg via ORAL
  Filled 2014-07-02: qty 4

## 2014-07-02 MED ORDER — HYDROCODONE-HOMATROPINE 5-1.5 MG/5ML PO SYRP
5.0000 mL | ORAL_SOLUTION | ORAL | Status: DC | PRN
  Administered 2014-07-02 – 2014-07-06 (×10): 5 mL via ORAL
  Filled 2014-07-02 (×11): qty 5

## 2014-07-02 MED ORDER — VANCOMYCIN HCL 10 G IV SOLR
1250.0000 mg | Freq: Once | INTRAVENOUS | Status: AC
  Administered 2014-07-02: 1250 mg via INTRAVENOUS
  Filled 2014-07-02: qty 1250

## 2014-07-02 MED ORDER — SODIUM CHLORIDE 0.9 % IV BOLUS (SEPSIS)
1000.0000 mL | INTRAVENOUS | Status: AC
  Administered 2014-07-02: 1000 mL via INTRAVENOUS

## 2014-07-02 MED ORDER — PIPERACILLIN-TAZOBACTAM 3.375 G IVPB
3.3750 g | Freq: Three times a day (TID) | INTRAVENOUS | Status: DC
  Administered 2014-07-02: 3.375 g via INTRAVENOUS
  Filled 2014-07-02 (×2): qty 50

## 2014-07-02 MED ORDER — HYDROCODONE-ACETAMINOPHEN 5-325 MG PO TABS
1.0000 | ORAL_TABLET | ORAL | Status: DC | PRN
  Administered 2014-07-02: 1 via ORAL
  Administered 2014-07-03 – 2014-07-04 (×2): 2 via ORAL
  Filled 2014-07-02: qty 2
  Filled 2014-07-02: qty 1
  Filled 2014-07-02: qty 2

## 2014-07-02 MED ORDER — ACETAMINOPHEN 325 MG PO TABS
650.0000 mg | ORAL_TABLET | Freq: Once | ORAL | Status: AC
  Administered 2014-07-02: 650 mg via ORAL
  Filled 2014-07-02: qty 2

## 2014-07-02 NOTE — ED Provider Notes (Signed)
CSN: [id number]     Arrival date & time 07/02/14  1256 History   First MD Initiated Contact with Patient 07/02/14 1311     Chief Complaint  Patient presents with  . Cough  . Headache   (Consider location/radiation/quality/duration/timing/severity/associated sxs/prior Treatment) HPI Colleen Cooper is a 74 yo email with cough and headache 8 days. Daughter reports symptoms started with headache and dry, nonproductive cough over a week ago followed by general fatigue and general muscle aches. She was seen that day in the [facility] emergency department and discharged with diagnosis of a viral illness. Her symptoms continued until 4 days ago and she was reevaluated in the emergency department [place] and discharged again being told she had a viral illness. She presents today with continuation of the symptoms but also diarrhea 2 days. She denies any fever at home, chest pain chills, nausea, vomiting or focal deficit.  Past Medical History  Diagnosis Date  . Barrett's esophagus     resolved by last endoscopy Dec 2011  . Fibromyalgia syndrome    Past Surgical History  Procedure Laterality Date  . Shoulder arthroscopy w/ labral repair  2003    right shoulder   . Abdominal hysterectomy    . Oophorectomy  1970  . Knee arthroscopy  2006    right knee  . Shoulder arthroscopy      remote  . Tonsillectomy     History reviewed. No pertinent family history. History  Substance Use Topics  . Smoking status: Former Smoker    Quit date: 12/07/1978  . Smokeless tobacco: Never Used  . Alcohol Use: Yes   OB History    No data available     Review of Systems  Constitutional: Positive for fatigue. Negative for fever and chills.  HENT: Negative for sore throat.   Eyes: Negative for visual disturbance.  Respiratory: Positive for cough. Negative for shortness of breath.   Cardiovascular: Negative for chest pain and leg swelling.  Gastrointestinal: Positive for diarrhea. Negative for nausea and  vomiting.  Genitourinary: Negative for dysuria.  Musculoskeletal: Positive for myalgias.  Skin: Positive for rash.  Neurological: Positive for headaches. Negative for weakness and numbness.    Allergies  Review of patient's allergies indicates no known allergies.  Home Medications   Prior to Admission medications   Medication Sig Start Date End Date Taking? Authorizing Provider  citalopram (CELEXA) 10 MG tablet Take 1 tablet (10 mg total) by mouth daily. 01/30/13   Sherlene Shams, MD  cyclobenzaprine (FLEXERIL) 5 MG tablet Take 1 tablet (5 mg total) by mouth 3 (three) times daily as needed for muscle spasms. 07/29/12   Raquel Conni Elliot, NP  DULoxetine (CYMBALTA) 30 MG capsule Take 1 capsule (30 mg total) by mouth daily. 01/29/13   Sherlene Shams, MD  estradiol (ESTRACE) 1 MG tablet Take 1 tablet (1 mg total) by mouth daily. 01/13/13   Sherlene Shams, MD  fish oil-omega-3 fatty acids 1000 MG capsule Take 2 g by mouth daily.    Historical Provider, MD  Flurbiprofen-Baclofen-Lido 15-4-5 % CREA Apply 1 application topically 2 (two) times a week. To affected Irving Burton 02/10/13   Sherlene Shams, MD  fluticasone (FLONASE) 50 MCG/ACT nasal spray Place 2 sprays into the nose daily. 03/19/12   Dale [place], MD  Magnesium 100 MG CAPS Take by mouth.    Historical Provider, MD  TDaP Leda Min) 5-2.5-18.5 LF-MCG/0.5 injection Inject 0.5 mLs into the muscle once. 01/29/13   Sherlene Shams, MD  traMADol (ULTRAM) 50 MG tablet Take 1 tablet (50 mg total) by mouth every 8 (eight) hours as needed for pain. 07/29/12   Raquel M Rey, NP   BP 109/44 mmHg  Pulse 97  Temp(Src) 102 F (38.9 C)  SpO2 93% Physical Exam  Constitutional: She is oriented to person, place, and time. She appears well-developed and well-nourished. No distress.  HENT:  Head: Normocephalic and atraumatic.  Mouth/Throat: Oropharynx is clear and moist. No oropharyngeal exudate.  Eyes: Conjunctivae are normal. Pupils are equal, round, and reactive to  light.  Neck: Neck supple. No thyromegaly present.  Cardiovascular: Normal rate, regular rhythm and intact distal pulses.   Pulmonary/Chest: Effort normal and breath sounds normal. No respiratory distress. She has no wheezes. She has no rales. She exhibits no tenderness.  Abdominal: Soft. There is tenderness in the right upper quadrant. There is positive Murphy's sign. There is no rigidity, no rebound, no guarding, no CVA tenderness and no tenderness at McBurney's point.    Musculoskeletal: She exhibits no tenderness.  Lymphadenopathy:    She has no cervical adenopathy.  Neurological: She is alert and oriented to person, place, and time. She has normal strength. No cranial nerve deficit or sensory deficit. Coordination normal. GCS eye subscore is 4. GCS verbal subscore is 5. GCS motor subscore is 6.  Skin: Skin is warm and dry. Rash noted. Rash is vesicular. She is not diaphoretic.     Psychiatric: She has a normal mood and affect.  Nursing note and vitals reviewed.   ED Course  Procedures (including critical care time) Labs Review Labs Reviewed  CBC WITH DIFFERENTIAL - Abnormal; Notable for the following:    WBC 3.6 (*)    RBC 3.73 (*)    Hemoglobin 11.2 (*)    HCT 31.9 (*)    Lymphs Abs 0.5 (*)    All other components within normal limits  COMPREHENSIVE METABOLIC PANEL - Abnormal; Notable for the following:    Sodium 129 (*)    Potassium 3.3 (*)    Chloride 94 (*)    Albumin 3.0 (*)    AST 383 (*)    ALT 220 (*)    GFR calc non Af Amer 52 (*)    GFR calc Af Amer 60 (*)    Anion gap 16 (*)    All other components within normal limits  CULTURE, BLOOD (ROUTINE X 2)  CULTURE, BLOOD (ROUTINE X 2)  LIPASE, BLOOD   Imaging Review Dg Chest 2 View  07/02/2014   CLINICAL DATA:  Approximate 1-1/2 week history of cough, chills and headache. Fever of 102 degrees F. Possible shingles involving the right side of the back.  EXAM: CHEST  2 VIEW  COMPARISON:  None.  FINDINGS:  Cardiomediastinal silhouette unremarkable. Mildly prominent bronchovascular markings diffusely and mild central peribronchial thickening. Lungs otherwise clear. No localized airspace consolidation. No pleural effusions. No pneumothorax. Normal pulmonary vascularity. Degenerative changes involving the thoracic spine and generalized osseous demineralization.  IMPRESSION: Mild changes of bronchitis and/or asthma without focal airspace pneumonia.   Electronically Signed   By: Hulan Saas M.D.   On: 07/02/2014 14:37   US Abdomen Limited Ruq  07/02/2014   CLINICAL DATA:  74 year old female with abnormal liver enzymes. Initial encounter.  EXAM: US ABDOMEN LIMITED - RIGHT UPPER QUADRANT  COMPARISON:  Abdomen ultrasound 08/05/2013.  FINDINGS: Gallbladder:  Dependent sludge in the gallbladder neck. Suggestion of shadowing gallstones (image 25), reportedly mobile during real-time scanning. Gallbladder wall thickness is normal  at 2 mm. No sonographic Murphy sign elicited. No pericholecystic fluid.  Common bile duct:  Diameter: 3 mm, normal  Liver:  Increased liver echogenicity again noted (image 10). No discrete liver lesion. No intrahepatic biliary ductal dilatation.  Other findings: Negative visible right kidney, and visible pancreas  IMPRESSION: 1. Cholelithiasis without evidence of acute cholecystitis. 2. Chronic hepatic steatosis.   Electronically Signed   By: Augusto GambleLee  Hall M.D.   On: 07/02/2014 16:25     EKG Interpretation None      MDM   Final diagnoses:  Cough  Elevated liver enzymes   74 yo with report of cough, headache, muscle aches x 8 days.  She has a fever to 102 in the ED today.  This is her 3rd visit to the ED with these symptoms with no improvement in symptoms and new onset diarrhea x 2 days.  Pt also has zoster rash noted to her back.  Discussed case with Dr. Blinda LeatherwoodPollina.    CBC, CMP, Lipase, CXR, blood culture x 2,  NS 1L bolus, tylenol and IV toradol.    Labs reviewed: CBC: 3.6, Na: 129, K:  3.3, AST: 383, ALT: 220.  With elevated liver enzymes concern for cholecystitis.  US done, shows cholelithiasis without cholecystitis and chronic hepatic steatosis.  On re-eval, pt remains alert and general pain is improved after meds, however blood pressure has decreased while in ED.  Due to age, fever and hypotension, will empirically cover for bacterial infection and start anti-viral treatment for shingles.    Consulted Dr. Arbutus Leasat (hospitalist) for admission, he will come to evaluate.   At change of shift, hand-off report given to Will Dansie, PA-C. Plan includes evaluation of pt by hospitalist and follow recommendations, most likely admission to hospital for further evaluation and treatment of fever and elevated liver enzymes.     Filed Vitals:   07/02/14 1500 07/02/14 1510 07/02/14 1515 07/02/14 1628  BP: 99/40  95/39 95/39  Pulse: 74  78 62  Temp:  99.7 F (37.6 C) 99.7 F (37.6 C)   Resp:    20  SpO2: 94%  96% 98%   Meds given in ED:  Medications  acyclovir (ZOVIRAX) 200 MG capsule 800 mg (not administered)  sodium chloride 0.9 % bolus 1,000 mL (0 mLs Intravenous Stopped 07/02/14 1618)  acetaminophen (TYLENOL) tablet 650 mg (650 mg Oral Given 07/02/14 1428)  ketorolac (TORADOL) 30 MG/ML injection 30 mg (30 mg Intravenous Given 07/02/14 1428)    New Prescriptions   No medications on file       Harle BattiestElizabeth Juquan Reznick, NP 07/02/14 2348  Gilda Creasehristopher J. Pollina, MD 07/03/14 2354

## 2014-07-02 NOTE — Progress Notes (Signed)
ANTIBIOTIC CONSULT NOTE - INITIAL  Pharmacy Consult for Vancomycin & Zosyn Indication: rule out sepsis  No Known Allergies  Patient Measurements: Height: 5\' 4"  (162.6 cm) Weight: 146 lb (66.225 kg) IBW/kg (Calculated) : 54.7  Vital Signs: Temp: 99.7 F (37.6 C) (01/07 1515) BP: 102/43 mmHg (01/07 1700) Pulse Rate: 60 (01/07 1700) Intake/Output from previous day:   Intake/Output from this shift:    Labs:  Recent Labs  07/02/14 1333  WBC 3.6*  HGB 11.2*  PLT 197  CREATININE 1.05   Estimated Creatinine Clearance: 46 mL/min (by C-G formula based on Cr of 1.05). No results for input(s): VANCOTROUGH, VANCOPEAK, VANCORANDOM, GENTTROUGH, GENTPEAK, GENTRANDOM, TOBRATROUGH, TOBRAPEAK, TOBRARND, AMIKACINPEAK, AMIKACINTROU, AMIKACIN in the last 72 hours.   Microbiology: No results found for this or any previous visit (from the past 720 hour(s)).  Medical History: Past Medical History  Diagnosis Date  . Barrett's esophagus     resolved by last endoscopy Dec 2011  . Fibromyalgia syndrome     Medications:  Scheduled:  . acyclovir  800 mg Oral Once   Infusions:  . piperacillin-tazobactam (ZOSYN)  IV    . vancomycin    . [START ON 07/03/2014] vancomycin     Assessment: 73 YOF presenting to [facility] on 07/02/14 c/o HA, cough, and diarrhea.  Patient presents with fever of 102, WBC.  Pharmacy has been consulted to dose vancomycin and zosyn for empiric coverage until further differential diagnosis.  Afebrile, WBC 3.6, HR & RR wnl.  Scr 1.05, Crcl ~ 55-60 mL/min.    Goal of Therapy:  Vancomycin trough level 15-20 mcg/ml  Plan:  - Vancomycin 1250 mg IV x 1 - Vancomycin 500 mg IV Q12H - Zosyn 3.375 g IV Q8H  - Monitor for clinical efficacy and trough levels when appropriate - Follow up cultures  Red ChristiansSamson Astella Desir, Pharm. D. Clinical Pharmacy Resident Pager: [phone] Ph: [phone] 07/02/2014 5:52 PM

## 2014-07-02 NOTE — ED Notes (Signed)
Pt back to room from ultrasound

## 2014-07-02 NOTE — ED Notes (Signed)
Pt c/o HA and coughing since DEC 30.  Pt states she feels flu-like symptoms.  Fever 102.  Went to hospital last Sunday and was told she had a viral infection.  HA Cough Chills and Diarreha.  Pt appears to have shingles on her right back .

## 2014-07-02 NOTE — ED Notes (Signed)
Off unit with xray. 

## 2014-07-02 NOTE — ED Notes (Addendum)
Pt continues to c/o discomfort after cough medication given.  MD notified.

## 2014-07-02 NOTE — ED Notes (Signed)
Called Pharmacy for pt meds.  Gave pt ice chips per PA.

## 2014-07-02 NOTE — ED Provider Notes (Signed)
Patient presented to the ER with fever and malaise. Has had a cough with mild sputum production. Has been experiencing some mild diarrhea as well.  Face to face Exam: HEENT - PERRLA Lungs - CTAB Heart - RRR, no M/R/G Abd - soft, nondistended. Tenderness right upper quadrant and epigastric region, no guarding or rebound Neuro - alert, oriented x3  Plan: Agent seen several times in another ER for symptoms. Has been told she has a viral illness. She did present with a fever of 102 today. Blood work reveals mildly elevated LFTs. Will obtain ultrasound to rule out acute gallbladder disease as well as liver abnormalities. Differential diagnosis would include cholecystitis, hepatitis, cholangitis.   Gilda Creasehristopher J. Avabella Wailes, MD 07/02/14 [phone]

## 2014-07-02 NOTE — H&P (Signed)
History and Physical  Colleen Cooper NFA:[id number] DOB: [birth year] DOA: 07/02/2014   PCP: Crecencio Mc, MD   Chief Complaint: Fever, chills, myalgias, coughing, shortness of breath  HPI:  74 year old female with a history of Barrett's esophagus, fibromyalgia presents with a 2 day history of fevers, chills cough, malaise, and myalgias. The patient went to the emergency Department at [facility] on 11/24/2013 and on 06/29/2013. The patient was sent home and diagnosed with a viral syndrome. The patient continued to feel bad. She called her primary care provider George Ina. The patient was given a prescription for azithromycin and Tessalon Perles which she took for 1 day. Because of her continued symptoms, the patient presented to the emergency department here at [facility] for further evaluation. Notably, the patient has not had any recent sick contacts. She complains of shortness of breath for the past week without any hemoptysis. She does have some intermittent chest discomfort. The patient's daughter supplements this history and states that the patient has been weak to the point where she has been soiling herself. The patient has been having diarrhea for the past 4-5 days. She denies any hematochezia or melena or abdominal pain. Other than the one time dose of azithromycin, the patient has not been on any antibiotics. She denies any vomiting, hematemesis, abdominal pain, dysuria, hematuria.  In the emergency department, the patient was noted to have a temperature 102.81F. Her oxygen saturation was 92% on room air. It improved to 96-98% with 2 L nasal cannula. Her blood pressure was soft in the 90s. The patient received 1 L of fluid. WBC was 3.6. AST 383, ALT 220, alk phosphatase 89, total bilirubin 1.1. Sodium was 129, potassium 3.3, serum creatinine 1.05. Chest x-ray showed prominent bronchovascular markings. Right upper quadrant ultrasound showed fatty liver, cholelithiasis without  cholecystitis. Assessment/Plan: Sepsis -Present at the time of admission -Blood cultures 2 sets have been obtained -Empiric vancomycin and Zosyn pending culture data -UA and urine culture -The patient also has what appears to be clinically shingles/VZV on her right flank which has been present for possibly 2-3 days -Given the patient's shortness of breath and hypoxemia this brings up the possibility of VZV pulmonary infection -Start patient on intravenous acyclovir -Obtain VZV PCR -Certainly, if the patient clinically improves, and diagnostic workup reveals another source of infection, intravenous acyclovir can be discontinued -Given the patient's current presentation, obtain CMV DNA, EBV DNA -IV fluids -Check lactic acid -Admitted to the stepdown unit Transaminasemia -This is likely due to the patient's sepsis although atypical viral infection such as CMV, EBV, VZV cannot be entirely ruled out given the patient's clinical presentation -Right upper quadrant ultrasound negative for cholecystitis -Hepatitis B surface antigen, hepatitis C antibody Diarrhea -C. difficile PCR Acute respiratory failure -Suspect underlying viral pulmonary infection -Viral respiratory panel including influenza PCR -Supple on oxygen, presently stable on 2 L nasal cannula Atypical chest pain  -Obtain EKG  -Cycle troponins  Herpes Zoster Dermatitis -IV acyclovir as discussed Leukopenia -Likely due to the patient's underlying viral infection versus sepsis LE Edema and Pain -Venous duplex lower extremities rule out DVT History of Barrett's esophagus -Continue PPI    Past Medical History  Diagnosis Date  . Barrett's esophagus     resolved by last endoscopy Dec 2011  . Fibromyalgia syndrome    Past Surgical History  Procedure Laterality Date  . Shoulder arthroscopy w/ labral repair  2003    right shoulder   . Abdominal hysterectomy    .  Oophorectomy  1970  . Knee arthroscopy  2006    right knee   . Shoulder arthroscopy      remote  . Tonsillectomy     Social History:  reports that she quit smoking about 35 years ago. She has never used smokeless tobacco. She reports that she drinks alcohol. She reports that she does not use illicit drugs.   History reviewed. No pertinent family history.   No Known Allergies    Prior to Admission medications   Medication Sig Start Date End Date Taking? Authorizing Provider  azithromycin (ZITHROMAX) 250 MG tablet Take 250 mg by mouth daily.   Yes Historical Provider, MD  benzonatate (TESSALON) 100 MG capsule Take 100 mg by mouth 2 (two) times daily.   Yes Historical Provider, MD  guaifenesin (ROBITUSSIN) 100 MG/5ML syrup Take 200 mg by mouth 3 (three) times daily as needed for cough.   Yes Historical Provider, MD  ibuprofen (ADVIL,MOTRIN) 400 MG tablet Take 400 mg by mouth every 6 (six) hours as needed for fever or headache.   Yes Historical Provider, MD  TDaP (BOOSTRIX) 5-2.5-18.5 LF-MCG/0.5 injection Inject 0.5 mLs into the muscle once. 01/29/13  Yes Crecencio Mc, MD  traMADol (ULTRAM) 50 MG tablet Take 1 tablet (50 mg total) by mouth every 8 (eight) hours as needed for pain. 07/29/12  Yes Raquel Dagoberto Ligas, NP  citalopram (CELEXA) 10 MG tablet Take 1 tablet (10 mg total) by mouth daily. Patient not taking: Reported on 07/02/2014 01/30/13   Crecencio Mc, MD  cyclobenzaprine (FLEXERIL) 5 MG tablet Take 1 tablet (5 mg total) by mouth 3 (three) times daily as needed for muscle spasms. Patient not taking: Reported on 07/02/2014 07/29/12   Sherryl Barters, NP  DULoxetine (CYMBALTA) 30 MG capsule Take 1 capsule (30 mg total) by mouth daily. Patient not taking: Reported on 07/02/2014 01/29/13   Crecencio Mc, MD  estradiol (ESTRACE) 1 MG tablet Take 1 tablet (1 mg total) by mouth daily. Patient not taking: Reported on 07/02/2014 01/13/13   Crecencio Mc, MD  Flurbiprofen-Baclofen-Lido 15-4-5 % CREA Apply 1 application topically 2 (two) times a week. To affected  areas3 Patient not taking: Reported on 07/02/2014 02/10/13   Crecencio Mc, MD  fluticasone [facility]) 50 MCG/ACT nasal spray Place 2 sprays into the nose daily. Patient not taking: Reported on 07/02/2014 03/19/12   Einar Pheasant, MD    Review of Systems:  Constitutional:  No weight loss, night sweats, Fevers, chills, fatigue.  Head&Eyes: No headache.  No vision loss.   ENT:  No Difficulty swallowing,Tooth/dental problems,Sore throat,   Cardio-vascular:  No Orthopnea, PND, swelling in lower extremities,  dizziness, palpitations  GI:  No  abdominal pain, nausea, vomiting,  loss of appetite, hematochezia, melena, heartburn, indigestion, Resp:   No coughing up of blood .No wheezing.No chest wall deformity  Skin:  Right flank rash GU:  no dysuria, change in color of urine, no urgency or frequency. Right flank pain Musculoskeletal:  No joint pain or swelling. No decreased range of motion. No back pain.  Psych:  No change in mood or affect.  Neurologic: No headache, no dysesthesia, no focal weakness, no vision loss. No syncope  Physical Exam: Filed Vitals:   07/02/14 1628 07/02/14 1630 07/02/14 1700 07/02/14 1720  BP: 95/39 104/41 102/43   Pulse: 62 75 60   Temp:      Resp: _0 Height:    _1  (1.626 m)  Weight:    66.225 kg (146 lb)  SpO2: 98% 98% 94%    General:  A&O x 3, NAD, nontoxic, pleasant/cooperative Head/Eye: No conjunctival hemorrhage, no icterus, Sp[place]/AT, No nystagmus ENT:  No icterus,  No thrush, good dentition, no pharyngeal exudate Neck:  No masses, no lymphadenpathy, no bruits CV:  RRR, no rub, no gallop, no S3 Lung:  CTAB, good air movement, no wheeze, no rhonchi Abdomen: soft/NT, +BS, nondistended, no peritoneal signs Ext: No cyanosis, No rashes, No petechiae, No lymphangitis, 1+LE edema Neuro: CNII-XII intact, strength 4/5 in bilateral upper and lower extremities, no dysmetria  Labs on Admission:  Basic Metabolic Panel:  Recent Labs Lab  07/02/14 1333  NA 129*  K 3.3*  CL 94*  CO2 19  GLUCOSE 99  BUN 10  CREATININE 1.05  CALCIUM 8.4   Liver Function Tests:  Recent Labs Lab 07/02/14 1333  AST 383*  ALT 220*  ALKPHOS 89  BILITOT 1.1  PROT 6.4  ALBUMIN 3.0*    Recent Labs Lab 07/02/14 1512  LIPASE 46   No results for input(s): AMMONIA in the last 168 hours. CBC:  Recent Labs Lab 07/02/14 1333  WBC 3.6*  NEUTROABS 2.8  HGB 11.2*  HCT 31.9*  MCV 85.5  PLT 197   Cardiac Enzymes: No results for input(s): CKTOTAL, CKMB, CKMBINDEX, TROPONINI in the last 168 hours. BNP: Invalid input(s): POCBNP CBG: No results for input(s): GLUCAP in the last 168 hours.  Radiological Exams on Admission: Dg Chest 2 View  07/02/2014   CLINICAL DATA:  Approximate 1-1/2 week history of cough, chills and headache. Fever of 102 degrees F. Possible shingles involving the right side of the back.  EXAM: CHEST  2 VIEW  COMPARISON:  None.  FINDINGS: Cardiomediastinal silhouette unremarkable. Mildly prominent bronchovascular markings diffusely and mild central peribronchial thickening. Lungs otherwise clear. No localized airspace consolidation. No pleural effusions. No pneumothorax. Normal pulmonary vascularity. Degenerative changes involving the thoracic spine and generalized osseous demineralization.  IMPRESSION: Mild changes of bronchitis and/or asthma without focal airspace pneumonia.   Electronically Signed   By: Evangeline Dakin M.D.   On: 07/02/2014 14:37   US Abdomen Limited Ruq  07/02/2014   CLINICAL DATA:  74 year old female with abnormal liver enzymes. Initial encounter.  EXAM: US ABDOMEN LIMITED - RIGHT UPPER QUADRANT  COMPARISON:  Abdomen ultrasound 08/05/2013.  FINDINGS: Gallbladder:  Dependent sludge in the gallbladder neck. Suggestion of shadowing gallstones (image 25), reportedly mobile during real-time scanning. Gallbladder wall thickness is normal at 2 mm. No sonographic Murphy sign elicited. No pericholecystic fluid.   Common bile duct:  Diameter: 3 mm, normal  Liver:  Increased liver echogenicity again noted (image 10). No discrete liver lesion. No intrahepatic biliary ductal dilatation.  Other findings: Negative visible right kidney, and visible pancreas  IMPRESSION: 1. Cholelithiasis without evidence of acute cholecystitis. 2. Chronic hepatic steatosis.   Electronically Signed   By: Lars Pinks M.D.   On: 07/02/2014 16:25    EKG: Independently reviewed. pending    Time spent:70 minutes Code Status:   FULL Family Communication:   Daughter updated at bedside   Tilmon Wisehart, DO  [facility] Pager [phone]  If 7PM-7AM, please contact night-coverage [email or web address] Password [facility] 07/02/2014, 6:04 PM

## 2014-07-02 NOTE — ED Notes (Signed)
Pt off unit with ultrasound 

## 2014-07-03 DIAGNOSIS — J9600 Acute respiratory failure, unspecified whether with hypoxia or hypercapnia: Secondary | ICD-10-CM

## 2014-07-03 DIAGNOSIS — R197 Diarrhea, unspecified: Secondary | ICD-10-CM

## 2014-07-03 DIAGNOSIS — R740 Nonspecific elevation of levels of transaminase and lactic acid dehydrogenase [LDH]: Secondary | ICD-10-CM

## 2014-07-03 LAB — CBC
HEMATOCRIT: 29.8 % — AB (ref 36.0–46.0)
Hemoglobin: 10.6 g/dL — ABNORMAL LOW (ref 12.0–15.0)
MCH: 30.4 pg (ref 26.0–34.0)
MCHC: 35.6 g/dL (ref 30.0–36.0)
MCV: 85.4 fL (ref 78.0–100.0)
Platelets: 186 10*3/uL (ref 150–400)
RBC: 3.49 MIL/uL — ABNORMAL LOW (ref 3.87–5.11)
RDW: 13.4 % (ref 11.5–15.5)
WBC: 2.9 10*3/uL — AB (ref 4.0–10.5)

## 2014-07-03 LAB — INFLUENZA PANEL BY PCR (TYPE A & B)
H1N1 flu by pcr: NOT DETECTED
Influenza A By PCR: NEGATIVE
Influenza B By PCR: NEGATIVE

## 2014-07-03 LAB — CULTURE, BLOOD (SINGLE)

## 2014-07-03 LAB — COMPREHENSIVE METABOLIC PANEL
ALT: 186 U/L — ABNORMAL HIGH (ref 0–35)
AST: 301 U/L — AB (ref 0–37)
Albumin: 2.5 g/dL — ABNORMAL LOW (ref 3.5–5.2)
Alkaline Phosphatase: 71 U/L (ref 39–117)
Anion gap: 11 (ref 5–15)
BILIRUBIN TOTAL: 0.8 mg/dL (ref 0.3–1.2)
BUN: 12 mg/dL (ref 6–23)
CO2: 20 mmol/L (ref 19–32)
CREATININE: 1.01 mg/dL (ref 0.50–1.10)
Calcium: 8 mg/dL — ABNORMAL LOW (ref 8.4–10.5)
Chloride: 101 mEq/L (ref 96–112)
GFR calc Af Amer: 63 mL/min — ABNORMAL LOW (ref >90)
GFR calc non Af Amer: 55 mL/min — ABNORMAL LOW (ref >90)
Glucose, Bld: 114 mg/dL — ABNORMAL HIGH (ref 70–99)
POTASSIUM: 2.5 mmol/L — AB (ref 3.5–5.1)
SODIUM: 132 mmol/L — AB (ref 135–145)
TOTAL PROTEIN: 5.2 g/dL — AB (ref 6.0–8.3)

## 2014-07-03 LAB — HEPATITIS C ANTIBODY: HCV Ab: NEGATIVE

## 2014-07-03 LAB — HEPATITIS B SURFACE ANTIGEN: Hepatitis B Surface Ag: NEGATIVE

## 2014-07-03 LAB — TROPONIN I: TROPONIN I: 0.03 ng/mL (ref <0.031)

## 2014-07-03 LAB — MRSA PCR SCREENING: MRSA BY PCR: NEGATIVE

## 2014-07-03 LAB — MAGNESIUM: MAGNESIUM: 1.9 mg/dL (ref 1.5–2.5)

## 2014-07-03 LAB — LACTIC ACID, PLASMA: Lactic Acid, Venous: 1 mmol/L (ref 0.5–2.2)

## 2014-07-03 LAB — BRAIN NATRIURETIC PEPTIDE: B Natriuretic Peptide: 175.3 pg/mL — ABNORMAL HIGH (ref 0.0–100.0)

## 2014-07-03 MED ORDER — POTASSIUM CHLORIDE CRYS ER 20 MEQ PO TBCR: 40.0000 meq | EXTENDED_RELEASE_TABLET | Freq: Three times a day (TID) | ORAL | Status: DC

## 2014-07-03 MED ORDER — ACETAMINOPHEN 325 MG PO TABS
650.0000 mg | ORAL_TABLET | Freq: Four times a day (QID) | ORAL | Status: DC | PRN
  Administered 2014-07-03 – 2014-07-06 (×2): 650 mg via ORAL
  Filled 2014-07-03 (×2): qty 2

## 2014-07-03 MED ORDER — ACYCLOVIR SODIUM 50 MG/ML IV SOLN
650.0000 mg | Freq: Three times a day (TID) | INTRAVENOUS | Status: DC
  Administered 2014-07-03: 650 mg via INTRAVENOUS
  Filled 2014-07-03 (×3): qty 13

## 2014-07-03 MED ORDER — ONDANSETRON HCL 4 MG PO TABS: 4.0000 mg | ORAL_TABLET | Freq: Four times a day (QID) | ORAL | Status: DC | PRN

## 2014-07-03 MED ORDER — ACETAMINOPHEN 650 MG RE SUPP: 650.0000 mg | Freq: Four times a day (QID) | RECTAL | Status: DC | PRN

## 2014-07-03 MED ORDER — PANTOPRAZOLE SODIUM 40 MG PO TBEC
40.0000 mg | DELAYED_RELEASE_TABLET | Freq: Every day | ORAL | Status: DC
  Administered 2014-07-03 – 2014-07-06 (×4): 40 mg via ORAL
  Filled 2014-07-03 (×4): qty 1

## 2014-07-03 MED ORDER — GABAPENTIN 300 MG PO CAPS
300.0000 mg | ORAL_CAPSULE | Freq: Two times a day (BID) | ORAL | Status: DC
  Administered 2014-07-03 (×2): 300 mg via ORAL
  Filled 2014-07-03 (×4): qty 1

## 2014-07-03 MED ORDER — PIPERACILLIN-TAZOBACTAM 3.375 G IVPB
3.3750 g | Freq: Three times a day (TID) | INTRAVENOUS | Status: DC
  Administered 2014-07-03 – 2014-07-04 (×4): 3.375 g via INTRAVENOUS
  Filled 2014-07-03 (×7): qty 50

## 2014-07-03 MED ORDER — PIPERACILLIN-TAZOBACTAM 3.375 G IVPB 30 MIN
3.3750 g | Freq: Three times a day (TID) | INTRAVENOUS | Status: DC
  Administered 2014-07-03: 3.375 g via INTRAVENOUS

## 2014-07-03 MED ORDER — MENTHOL 3 MG MT LOZG
1.0000 | LOZENGE | OROMUCOSAL | Status: DC | PRN
  Filled 2014-07-03: qty 9

## 2014-07-03 MED ORDER — DEXTROSE 5 % IV SOLN
650.0000 mg | Freq: Three times a day (TID) | INTRAVENOUS | Status: DC
  Administered 2014-07-03: 650 mg via INTRAVENOUS
  Filled 2014-07-03 (×4): qty 13

## 2014-07-03 MED ORDER — POTASSIUM CHLORIDE 2 MEQ/ML IV SOLN
INTRAVENOUS | Status: DC
  Administered 2014-07-03 – 2014-07-04 (×2): via INTRAVENOUS
  Filled 2014-07-03 (×5): qty 1000

## 2014-07-03 MED ORDER — ONDANSETRON HCL 4 MG/2ML IJ SOLN: 4.0000 mg | Freq: Four times a day (QID) | INTRAMUSCULAR | Status: DC | PRN

## 2014-07-03 MED ORDER — GABAPENTIN 600 MG PO TABS
300.0000 mg | ORAL_TABLET | Freq: Two times a day (BID) | ORAL | Status: DC
  Filled 2014-07-03 (×3): qty 0.5

## 2014-07-03 MED ORDER — TRAMADOL HCL 50 MG PO TABS
50.0000 mg | ORAL_TABLET | Freq: Four times a day (QID) | ORAL | Status: DC | PRN
  Administered 2014-07-03 – 2014-07-04 (×4): 50 mg via ORAL
  Filled 2014-07-03 (×4): qty 1

## 2014-07-03 MED ORDER — PHENOL 1.4 % MT LIQD
2.0000 | OROMUCOSAL | Status: DC | PRN
  Filled 2014-07-03: qty 177

## 2014-07-03 MED ORDER — POTASSIUM CHLORIDE CRYS ER 20 MEQ PO TBCR
40.0000 meq | EXTENDED_RELEASE_TABLET | ORAL | Status: AC
  Administered 2014-07-03 (×3): 40 meq via ORAL
  Filled 2014-07-03 (×3): qty 2

## 2014-07-03 MED ORDER — ENOXAPARIN SODIUM 40 MG/0.4ML ~~LOC~~ SOLN
40.0000 mg | Freq: Every day | SUBCUTANEOUS | Status: DC
  Administered 2014-07-04 – 2014-07-06 (×3): 40 mg via SUBCUTANEOUS
  Filled 2014-07-03 (×5): qty 0.4

## 2014-07-03 MED ORDER — SODIUM CHLORIDE 0.9 % IV SOLN
INTRAVENOUS | Status: DC
  Administered 2014-07-03 (×2): via INTRAVENOUS
  Filled 2014-07-03 (×6): qty 1000

## 2014-07-03 MED ORDER — LEVALBUTEROL HCL 0.63 MG/3ML IN NEBU: 0.6300 mg | INHALATION_SOLUTION | Freq: Four times a day (QID) | RESPIRATORY_TRACT | Status: DC | PRN

## 2014-07-03 MED ORDER — SODIUM CHLORIDE 0.9 % IV BOLUS (SEPSIS)
500.0000 mL | Freq: Once | INTRAVENOUS | Status: AC
  Administered 2014-07-03: 500 mL via INTRAVENOUS

## 2014-07-03 MED ORDER — SODIUM CHLORIDE 0.9 % IJ SOLN
3.0000 mL | Freq: Two times a day (BID) | INTRAMUSCULAR | Status: DC
  Administered 2014-07-03 – 2014-07-06 (×3): 3 mL via INTRAVENOUS

## 2014-07-03 NOTE — Care Management Note (Addendum)
    Page 1 of 1   07/06/2014     12:19:19 PM CARE MANAGEMENT NOTE 07/06/2014  Patient:  Colleen Cooper,Colleen Cooper   Account Number:  [id number]  Date Initiated:  07/03/2014  Documentation initiated by:  Junius CreamerWELL,DEBBIE  Subjective/Objective Assessment:   adm w sepsis     Action/Plan:   from home, pcp dr Duncan Dullteresa tullo   Anticipated DC Date:  07/06/2014   Anticipated DC Plan:  HOME W HOME HEALTH SERVICES      DC Planning Services  CM consult      Choice offered to / List presented to:             Status of service:  Completed, signed off Medicare Important Message given?  YES (If response is "NO", the following Medicare IM given date fields will be blank) Date Medicare IM given:  07/06/2014 Medicare IM given by:  Letha CapeAYLOR,Paquita Printy Date Additional Medicare IM given:   Additional Medicare IM given by:    Discharge Disposition:  HOME/SELF CARE  Per UR Regulation:  Reviewed for med. necessity/level of care/duration of stay  If discussed at Long Length of Stay Meetings, dates discussed:    Comments:  07/06/14 1218 Letha Capeeborah Azaylea Maves RN, BSN [phone]  patient is for dc today, no needs anticipated per physical therapy no pt f/u needed.

## 2014-07-03 NOTE — Progress Notes (Signed)
PROGRESS NOTE  Colleen CleverSarah L Cooper AOZ:[id number] DOB: [birth year] DOA: 07/02/2014 PCP: Sherlene ShamsULLO, TERESA L, MD  HPI/Recap of past 24 hours: Patient is 74 year old female in usual good health who in the last 9 days started having progressively worsening cough, loss of appetite and weakness with diarrhea starting approximately one to 2 days prior and was admitted on 1/7 for sepsis.  Initially concerns of a back side rash for shingles, and patient started on Zovirax and had varicella zoster workup.  Hospital day 2, patient felt somewhat better, still with some cough although somewhat improved. Blood pressures have remained very soft and patient placed in stepdown unit on continuous IV fluids. Patient's main complaint is just of hoarseness and sore throat with coughing. Feels that shortness of breath is a little bit better. Influenza panel pending  Assessment/Plan: Principal Problem:   Sepsis with acute respiratory failure and likely pneumonia: Patient noted be hypotensive despite fluid resuscitation. Blood cultures pending. White count has stayed normal. We'll plan to continue aggressive fluid resuscitation and IV antibiotics and keep in stepdown. When necessary nebulizers Active Problems:    Transaminasemia: Improving, likely shock liver from sepsis    Diarrhea: Does not look to be infectious. Question source?Marland Kitchen. No episodes since admission   Code Status: Full code  Family Communication: Daughter at the bedside  Disposition Plan: Continue step down until blood pressure stabilized, and likely several more days until breathing normalized   Consultants:  None  Procedures:  None  Antibiotics:  IV vancomycin 1/7-present  IV Zosyn 1/7-present   Objective: BP 123/44 mmHg  Pulse 73  Temp(Src) 97.5 F (36.4 C) (Oral)  Resp 20  Ht 5\' 4"  (1.626 m)  Wt 66.225 kg (146 lb)  BMI 25.05 kg/m2  SpO2 94%  Intake/Output Summary (Last 24 hours) at 07/03/14 1837 Last data filed at 07/03/14 1355  Gross per 24 hour  Intake     53 ml  Output    300 ml  Net   -247 ml   Filed Weights   07/02/14 1720  Weight: 66.225 kg (146 lb)    Exam:   General:  Alert and oriented 3, mild distress from cough  Cardiovascular: Regular rate and rhythm, S1-S2  Respiratory: Bilateral end expiratory wheeze  Abdomen: Soft, nontender, nondistended, normoactive bowel sounds  Musculoskeletal: No clubbing or cyanosis or edema   Data Reviewed: Basic Metabolic Panel:  Recent Labs Lab 07/02/14 1333 07/03/14 0054 07/03/14 1611  NA 129* 132*  --   K 3.3* 2.5*  --   CL 94* 101  --   CO2 19 20  --   GLUCOSE 99 114*  --   BUN 10 12  --   CREATININE 1.05 1.01  --   CALCIUM 8.4 8.0*  --   MG  --   --  1.9   Liver Function Tests:  Recent Labs Lab 07/02/14 1333 07/03/14 0054  AST 383* 301*  ALT 220* 186*  ALKPHOS 89 71  BILITOT 1.1 0.8  PROT 6.4 5.2*  ALBUMIN 3.0* 2.5*    Recent Labs Lab 07/02/14 1512  LIPASE 46   No results for input(s): AMMONIA in the last 168 hours. CBC:  Recent Labs Lab 07/02/14 1333 07/03/14 0054  WBC 3.6* 2.9*  NEUTROABS 2.8  --   HGB 11.2* 10.6*  HCT 31.9* 29.8*  MCV 85.5 85.4  PLT 197 186   Cardiac Enzymes:    Recent Labs Lab 07/03/14 0054 07/03/14 0743 07/03/14 1614  TROPONINI <0.03 0.03 <0.03  BNP (last 3 results) No results for input(s): PROBNP in the last 8760 hours. CBG: No results for input(s): GLUCAP in the last 168 hours.  Recent Results (from the past 240 hour(s))  Blood culture (routine x 2)     Status: None (Preliminary result)   Collection Time: 07/02/14  2:04 PM  Result Value Ref Range Status   Specimen Description BLOOD RIGHT ARM  Final   Special Requests BOTTLES DRAWN AEROBIC AND ANAEROBIC  Final   Culture   Final           BLOOD CULTURE RECEIVED NO GROWTH TO DATE CULTURE WILL BE HELD FOR 5 DAYS BEFORE ISSUING A FINAL NEGATIVE REPORT Performed at [facility]    Report Status PENDING  Incomplete    Blood culture (routine x 2)     Status: None (Preliminary result)   Collection Time: 07/02/14  2:54 PM  Result Value Ref Range Status   Specimen Description BLOOD HAND RIGHT  Final   Special Requests BOTTLES DRAWN AEROBIC AND ANAEROBIC 5CC  Final   Culture   Final           BLOOD CULTURE RECEIVED NO GROWTH TO DATE CULTURE WILL BE HELD FOR 5 DAYS BEFORE ISSUING A FINAL NEGATIVE REPORT Performed at [facility]    Report Status PENDING  Incomplete  MRSA PCR Screening     Status: None   Collection Time: 07/03/14  1:35 PM  Result Value Ref Range Status   MRSA by PCR NEGATIVE NEGATIVE Final    Comment:        The GeneXpert MRSA Assay (FDA approved for NASAL specimens only), is one component of a comprehensive MRSA colonization surveillance program. It is not intended to diagnose MRSA infection nor to guide or monitor treatment for MRSA infections.      Studies: Dg Chest 2 View  07/02/2014   CLINICAL DATA:  Approximate 1-1/2 week history of cough, chills and headache. Fever of 102 degrees F. Possible shingles involving the right side of the back.  EXAM: CHEST  2 VIEW  COMPARISON:  None.  FINDINGS: Cardiomediastinal silhouette unremarkable. Mildly prominent bronchovascular markings diffusely and mild central peribronchial thickening. Lungs otherwise clear. No localized airspace consolidation. No pleural effusions. No pneumothorax. Normal pulmonary vascularity. Degenerative changes involving the thoracic spine and generalized osseous demineralization.  IMPRESSION: Mild changes of bronchitis and/or asthma without focal airspace pneumonia.   Electronically Signed   By: Hulan Saas M.D.   On: 07/02/2014 14:37   US Abdomen Limited Ruq  07/02/2014   CLINICAL DATA:  74 year old female with abnormal liver enzymes. Initial encounter.  EXAM: US ABDOMEN LIMITED - RIGHT UPPER QUADRANT  COMPARISON:  Abdomen ultrasound 08/05/2013.  FINDINGS: Gallbladder:  Dependent sludge in the gallbladder  neck. Suggestion of shadowing gallstones (image 25), reportedly mobile during real-time scanning. Gallbladder wall thickness is normal at 2 mm. No sonographic Murphy sign elicited. No pericholecystic fluid.  Common bile duct:  Diameter: 3 mm, normal  Liver:  Increased liver echogenicity again noted (image 10). No discrete liver lesion. No intrahepatic biliary ductal dilatation.  Other findings: Negative visible right kidney, and visible pancreas  IMPRESSION: 1. Cholelithiasis without evidence of acute cholecystitis. 2. Chronic hepatic steatosis.   Electronically Signed   By: Augusto Gamble M.D.   On: 07/02/2014 16:25    Scheduled Meds: . acyclovir  650 mg Intravenous 3 times per day  . enoxaparin (LOVENOX) injection  40 mg Subcutaneous Daily  . gabapentin  300  mg Oral BID  . pantoprazole  40 mg Oral Daily  . piperacillin-tazobactam (ZOSYN)  IV  3.375 g Intravenous 3 times per day  . potassium chloride  40 mEq Oral Q4H  . sodium chloride  3 mL Intravenous Q12H  . vancomycin  500 mg Intravenous Q12H    Continuous Infusions: . sodium chloride 0.9 % 1,000 mL with potassium chloride 20 mEq infusion       Time spent: 35 minutes  Hollice Espy  [facility] Pager [phone]. If 7PM-7AM, please contact night-coverage at [email or web address], password [facility] 07/03/2014, 6:37 PM  LOS: 1 day

## 2014-07-03 NOTE — ED Notes (Signed)
IV team at bedside 

## 2014-07-03 NOTE — Progress Notes (Signed)
ANTIBIOTIC CONSULT NOTE - INITIAL  Pharmacy Consult for Acyclovir  Indication: ?Varicella PNA  No Known Allergies  Patient Measurements: Height: 5\' 4"  (162.6 cm) Weight: 146 lb (66.225 kg) IBW/kg (Calculated) : 54.7   Vital Signs: Temp: 99.7 F (37.6 C) (01/07 1515) BP: 104/44 mmHg (01/07 2330) Pulse Rate: 66 (01/07 2330)  Labs:  Recent Labs  07/02/14 1333  WBC 3.6*  HGB 11.2*  PLT 197  CREATININE 1.05   Estimated Creatinine Clearance: 46 mL/min (by C-G formula based on Cr of 1.05). No results for input(s): VANCOTROUGH, VANCOPEAK, VANCORANDOM, GENTTROUGH, GENTPEAK, GENTRANDOM, TOBRATROUGH, TOBRAPEAK, TOBRARND, AMIKACINPEAK, AMIKACINTROU, AMIKACIN in the last 72 hours.   Microbiology: No results found for this or any previous visit (from the past 720 hour(s)).  Medical History: Past Medical History  Diagnosis Date  . Barrett's esophagus     resolved by last endoscopy Dec 2011  . Fibromyalgia syndrome     Assessment: Already on vancomycin/zoysn, concern for varicella PNA, adding acyclovir. WBC 3.6, renal function ok for age, other labs as above.   Plan:  -Acyclovir 10mg /kg IV q8h (dosed on actual body weight with BMI <30) -De-escalate as able if varicella PNA ruled out  Abran DukeLedford, Shatonya Passon 07/03/2014,12:43 AM

## 2014-07-03 NOTE — Progress Notes (Signed)
Spoke with infection prevention regarding r/o CDIFF.  Pt asymptomatic, no stools since the first one on admission, no adb pain or other symptoms.  Infection prevention agreed that it was not necessary to culture stool unless pt starts to exhibit S/S.    Eliane DecreeSmith, Dajanae Brophy Moore, RN

## 2014-07-04 ENCOUNTER — Inpatient Hospital Stay (HOSPITAL_COMMUNITY): Payer: Medicare Other

## 2014-07-04 DIAGNOSIS — K760 Fatty (change of) liver, not elsewhere classified: Secondary | ICD-10-CM

## 2014-07-04 DIAGNOSIS — J189 Pneumonia, unspecified organism: Secondary | ICD-10-CM

## 2014-07-04 DIAGNOSIS — M797 Fibromyalgia: Secondary | ICD-10-CM

## 2014-07-04 LAB — COMPREHENSIVE METABOLIC PANEL
ALT: 196 U/L — AB (ref 0–35)
AST: 332 U/L — ABNORMAL HIGH (ref 0–37)
Albumin: 2.2 g/dL — ABNORMAL LOW (ref 3.5–5.2)
Alkaline Phosphatase: 112 U/L (ref 39–117)
Anion gap: 7 (ref 5–15)
BUN: <5 mg/dL — ABNORMAL LOW (ref 6–23)
CHLORIDE: 110 meq/L (ref 96–112)
CO2: 20 mmol/L (ref 19–32)
CREATININE: 0.8 mg/dL (ref 0.50–1.10)
Calcium: 7.7 mg/dL — ABNORMAL LOW (ref 8.4–10.5)
GFR calc non Af Amer: 72 mL/min — ABNORMAL LOW (ref >90)
GFR, EST AFRICAN AMERICAN: 84 mL/min — AB (ref >90)
GLUCOSE: 90 mg/dL (ref 70–99)
POTASSIUM: 4.1 mmol/L (ref 3.5–5.1)
SODIUM: 137 mmol/L (ref 135–145)
Total Bilirubin: 0.9 mg/dL (ref 0.3–1.2)
Total Protein: 4.9 g/dL — ABNORMAL LOW (ref 6.0–8.3)

## 2014-07-04 LAB — CBC
HEMATOCRIT: 29.5 % — AB (ref 36.0–46.0)
Hemoglobin: 10 g/dL — ABNORMAL LOW (ref 12.0–15.0)
MCH: 29.7 pg (ref 26.0–34.0)
MCHC: 33.9 g/dL (ref 30.0–36.0)
MCV: 87.5 fL (ref 78.0–100.0)
Platelets: 204 10*3/uL (ref 150–400)
RBC: 3.37 MIL/uL — AB (ref 3.87–5.11)
RDW: 14.1 % (ref 11.5–15.5)
WBC: 2.9 10*3/uL — ABNORMAL LOW (ref 4.0–10.5)

## 2014-07-04 LAB — URINE CULTURE
COLONY COUNT: NO GROWTH
Culture: NO GROWTH

## 2014-07-04 MED ORDER — DEXTROSE 5 % IV SOLN
500.0000 mg | INTRAVENOUS | Status: DC
  Administered 2014-07-04: 500 mg via INTRAVENOUS
  Filled 2014-07-04 (×2): qty 500

## 2014-07-04 MED ORDER — TRAMADOL HCL 50 MG PO TABS
50.0000 mg | ORAL_TABLET | Freq: Four times a day (QID) | ORAL | Status: DC | PRN
  Administered 2014-07-04 (×2): 50 mg via ORAL
  Filled 2014-07-04 (×3): qty 1

## 2014-07-04 MED ORDER — CEFTRIAXONE SODIUM IN DEXTROSE 20 MG/ML IV SOLN
1.0000 g | INTRAVENOUS | Status: DC
  Administered 2014-07-04 – 2014-07-05 (×2): 1 g via INTRAVENOUS
  Filled 2014-07-04 (×3): qty 50

## 2014-07-04 MED ORDER — AMITRIPTYLINE HCL 25 MG PO TABS
25.0000 mg | ORAL_TABLET | Freq: Every day | ORAL | Status: DC
  Administered 2014-07-04 – 2014-07-05 (×2): 25 mg via ORAL
  Filled 2014-07-04 (×3): qty 1

## 2014-07-04 MED ORDER — MORPHINE SULFATE 2 MG/ML IJ SOLN
2.0000 mg | Freq: Once | INTRAMUSCULAR | Status: AC
Start: 2014-07-04 — End: 2014-07-04
  Administered 2014-07-04: 2 mg via INTRAVENOUS
  Filled 2014-07-04: qty 1

## 2014-07-04 MED ORDER — GABAPENTIN 100 MG PO CAPS
100.0000 mg | ORAL_CAPSULE | Freq: Two times a day (BID) | ORAL | Status: DC
  Administered 2014-07-04 – 2014-07-06 (×5): 100 mg via ORAL
  Filled 2014-07-04 (×6): qty 1

## 2014-07-04 NOTE — Progress Notes (Signed)
Pt arrived to 5W16 via wheelchair.  Ambulated to the bed, VS taken.  Daughter at bedside.  Call bell within reach.  Will continue to monitor. Sondra ComeSilva, Oaklan Persons M, RN

## 2014-07-04 NOTE — Progress Notes (Signed)
PROGRESS NOTE  Colleen Cooper LKG:[id number] DOB: 02/18/1941 DOA: 07/02/2014 PCP: Sherlene Shams, MD  HPI/Recap of past 24 hours: Patient is 74 year old female in usual good health who in the last 9 days started having progressively worsening cough, loss of appetite and weakness with diarrhea starting approximately one to 2 days prior and was admitted on 1/7 for sepsis.  Initially concerns of a back side rash for shingles, and patient started on Zovirax and had varicella zoster workup.  By the following day, patient was feeling better with continued cough, and blood pressures remaining still soft.  Today, patient doing okay. Complains of some fibromyalgia pain. Still some slightly low blood pressures, but breathing much better and cough decreased. Lab work unrevealing other than some persistent transaminitis Assessment/Plan: Principal Problem:   Sepsis with acute respiratory failure and likely pneumonia: Resolved. Patient noted be hypotensive despite fluid resuscitation. Blood cultures pending. White count has stayed normal. After 2 days of fluid resuscitation, pressure soft, but patient asymptomatic. Not orthostatic. Suspect that she is chronically hypotensive, special settings of some of her chronic medications for fibromyalgia. Given hydration, x-ray repeated and this time noting severe left lower lobe pneumonia Active Problems:    Transaminasemia: Improved from initial, but about the same since yesterday. Reviewed previous ultrasound noting gallbladder sludge and also fatty liver. Suspect initial rise was from shock liver from sepsis and persistent may be from fatty liver    Diarrhea: Does not look to be infectious. Question source?Marland Kitchen No episodes since admission. May have been from gallbladder sludge.  Fibromyalgia: Patient on chronic Ultram and Neurontin.  Will try adding Elavil at night for overall better control  Fatty liver: As above Code Status: Full code  Family Communication:  Daughter at the bedside  Disposition Plan: Continue step down until blood pressure stabilized, and likely several more days until breathing normalized   Consultants:  None  Procedures:  None  Antibiotics:  IV vancomycin 1/7-1/9  IV Zosyn 1/7-1/9  IV Zithromax 1/9-present  IV Rocephin 1/9-present   Objective: BP 135/46 mmHg  Pulse 63  Temp(Src) 98 F (36.7 C) (Oral)  Resp 18  Ht  (1.626 m)  Wt 66.225 kg (146 lb)  BMI 25.05 kg/m2  SpO2 98%  Intake/Output Summary (Last 24 hours) at 07/04/14 1618 Last data filed at 07/04/14 1000  Gross per 24 hour  Intake    550 ml  Output    750 ml  Net   -200 ml   Filed Weights   07/02/14 1720  Weight: 66.225 kg (146 lb)    Exam:   General:  Alert and oriented 3, no acute distress  Cardiovascular: Regular rate and rhythm, S1-S2  Respiratory: Decreased breath sounds bibasilar, otherwise clear  Abdomen: Soft, nontender, nondistended, normoactive bowel sounds  Musculoskeletal: No clubbing or cyanosis or edema   Data Reviewed: Basic Metabolic Panel:  Recent Labs Lab 07/02/14 1333 07/03/14 0054 07/03/14 1611 07/04/14 0304  NA 129* 132*  --  137  K 3.3* 2.5*  --  4.1  CL 94* 101  --  110  CO2 19 20  --  20  GLUCOSE 99 114*  --  90  BUN 10 12  --  <5*  CREATININE 1.05 1.01  --  0.80  CALCIUM 8.4 8.0*  --  7.7*  MG  --   --  1.9  --    Liver Function Tests:  Recent Labs Lab 07/02/14 1333 07/03/14 0054 07/04/14 0304  AST 383* 301* 332*  ALT 220* 186* 196*  ALKPHOS 89 71 112  BILITOT 1.1 0.8 0.9  PROT 6.4 5.2* 4.9*  ALBUMIN 3.0* 2.5* 2.2*    Recent Labs Lab 07/02/14 1512  LIPASE 46   No results for input(s): AMMONIA in the last 168 hours. CBC:  Recent Labs Lab 07/02/14 1333 07/03/14 0054 07/04/14 0304  WBC 3.6* 2.9* 2.9*  NEUTROABS 2.8  --   --   HGB 11.2* 10.6* 10.0*  HCT 31.9* 29.8* 29.5*  MCV 85.5 85.4 87.5  PLT 197 186 204   Cardiac Enzymes:    Recent Labs Lab  07/03/14 0054 07/03/14 0743 07/03/14 1614  TROPONINI <0.03 0.03 <0.03   BNP (last 3 results) No results for input(s): PROBNP in the last 8760 hours. CBG: No results for input(s): GLUCAP in the last 168 hours.  Recent Results (from the past 240 hour(s))  Blood culture (routine x 2)     Status: None (Preliminary result)   Collection Time: 07/02/14  2:04 PM  Result Value Ref Range Status   Specimen Description BLOOD RIGHT ARM  Final   Special Requests BOTTLES DRAWN AEROBIC AND ANAEROBIC 5MLS  Final   Culture   Final           BLOOD CULTURE RECEIVED NO GROWTH TO DATE CULTURE WILL BE HELD FOR 5 DAYS BEFORE ISSUING A FINAL NEGATIVE REPORT [facility]    Report Status PENDING  Incomplete  Blood culture (routine x 2)     Status: None (Preliminary result)   Collection Time: 07/02/14  2:54 PM  Result Value Ref Range Status   Specimen Description BLOOD HAND RIGHT  Final   Special Requests BOTTLES DRAWN AEROBIC AND ANAEROBIC 5CC  Final   Culture   Final           BLOOD CULTURE RECEIVED NO GROWTH TO DATE CULTURE WILL BE HELD FOR 5 DAYS BEFORE ISSUING A FINAL NEGATIVE REPORT Performed at [facility]    Report Status PENDING  Incomplete  Urine culture     Status: None   Collection Time: 07/02/14  9:13 PM  Result Value Ref Range Status   Specimen Description URINE, CLEAN CATCH  Final   Special Requests NONE  Final   Colony Count NO GROWTH Performed at [facility]   Final   Culture NO GROWTH Performed at [facility]   Final   Report Status 07/04/2014 FINAL  Final  MRSA PCR Screening     Status: None   Collection Time: 07/03/14  1:35 PM  Result Value Ref Range Status   MRSA by PCR NEGATIVE NEGATIVE Final    Comment:        The GeneXpert MRSA Assay (FDA approved for NASAL specimens only), is one component of a comprehensive MRSA colonization surveillance program. It is not intended to diagnose MRSA infection nor to guide or monitor  treatment for MRSA infections.      Studies: No results found.  Scheduled Meds: . amitriptyline  25 mg Oral QHS  . enoxaparin (LOVENOX) injection  40 mg Subcutaneous Daily  . gabapentin  100 mg Oral BID  . pantoprazole  40 mg Oral Daily  . piperacillin-tazobactam (ZOSYN)  IV  3.375 g Intravenous 3 times per day  . sodium chloride  3 mL Intravenous Q12H  . vancomycin  500 mg Intravenous Q12H    Continuous Infusions: . sodium chloride 0.9 % 1,000 mL with potassium chloride 20 mEq infusion 100 mL/hr at 07/04/14 0816  Time spent: 35 minutes  Hollice Espy  [facility] Pager [phone]. If 7PM-7AM, please contact night-coverage at [email or web address], password [facility] 07/04/2014, 4:18 PM  LOS: 2 days

## 2014-07-04 NOTE — Progress Notes (Signed)
Report called to 5w 

## 2014-07-05 ENCOUNTER — Inpatient Hospital Stay (HOSPITAL_COMMUNITY): Payer: Medicare Other

## 2014-07-05 DIAGNOSIS — M79609 Pain in unspecified limb: Secondary | ICD-10-CM

## 2014-07-05 DIAGNOSIS — R609 Edema, unspecified: Secondary | ICD-10-CM

## 2014-07-05 DIAGNOSIS — J189 Pneumonia, unspecified organism: Secondary | ICD-10-CM | POA: Diagnosis present

## 2014-07-05 LAB — COMPREHENSIVE METABOLIC PANEL
ALK PHOS: 129 U/L — AB (ref 39–117)
ALT: 139 U/L — ABNORMAL HIGH (ref 0–35)
AST: 165 U/L — AB (ref 0–37)
Albumin: 2 g/dL — ABNORMAL LOW (ref 3.5–5.2)
Anion gap: 8 (ref 5–15)
CHLORIDE: 105 meq/L (ref 96–112)
CO2: 23 mmol/L (ref 19–32)
Calcium: 8.1 mg/dL — ABNORMAL LOW (ref 8.4–10.5)
Creatinine, Ser: 0.82 mg/dL (ref 0.50–1.10)
GFR calc Af Amer: 81 mL/min — ABNORMAL LOW (ref >90)
GFR calc non Af Amer: 70 mL/min — ABNORMAL LOW (ref >90)
Glucose, Bld: 97 mg/dL (ref 70–99)
Potassium: 4.1 mmol/L (ref 3.5–5.1)
SODIUM: 136 mmol/L (ref 135–145)
Total Bilirubin: 0.6 mg/dL (ref 0.3–1.2)
Total Protein: 4.8 g/dL — ABNORMAL LOW (ref 6.0–8.3)

## 2014-07-05 MED ORDER — AZITHROMYCIN 500 MG PO TABS
500.0000 mg | ORAL_TABLET | ORAL | Status: DC
  Administered 2014-07-05: 500 mg via ORAL
  Filled 2014-07-05 (×2): qty 1

## 2014-07-05 MED ORDER — IOHEXOL 300 MG/ML  SOLN
80.0000 mL | Freq: Once | INTRAMUSCULAR | Status: AC | PRN
  Administered 2014-07-05: 80 mL via INTRAVENOUS

## 2014-07-05 MED ORDER — METHOCARBAMOL 500 MG PO TABS
500.0000 mg | ORAL_TABLET | Freq: Once | ORAL | Status: AC
  Administered 2014-07-05: 500 mg via ORAL
  Filled 2014-07-05: qty 1

## 2014-07-05 NOTE — Evaluation (Signed)
Physical Therapy Evaluation and Discharge  Patient Details Name: Colleen Cooper MRN: [id number] DOB: Jan 11, 1941 Today's Date: 07/05/2014   History of Present Illness  74 year old female in usual good health who in the last 9 days started having progressively worsening cough, loss of appetite and weakness with diarrhea starting approximately one to 2 days prior and was admitted on 1/7 for sepsis.  likely PNA per chart.  Clinical Impression  Patient evaluated by Physical Therapy with no further acute PT needs identified. All education has been completed and the patient has no further questions. See below for any follow-up Physial Therapy or equipment needs. PT is signing off. Thank you for this referral.     Follow Up Recommendations No PT follow up;Supervision - Intermittent    Equipment Recommendations  None recommended by PT    Recommendations for Other Services       Precautions / Restrictions Precautions Precautions: None Precaution Comments: droplet precautions at this time  Restrictions Weight Bearing Restrictions: No      Mobility  Bed Mobility Overal bed mobility: Modified Independent             General bed mobility comments: no difficulty   Transfers Overall transfer level: Modified independent Equipment used: None             General transfer comment: no sway or LOB noted  Ambulation/Gait Ambulation/Gait assistance: Modified independent (Device/Increase time) Ambulation Distance (Feet): 200 Feet Assistive device: None Gait Pattern/deviations: WFL(Within Functional Limits) Gait velocity: decreased; guarded Gait velocity interpretation: Below normal speed for age/gender General Gait Details: pt at mod I; no LOB noted with high level balance activities   Stairs            Wheelchair Mobility    Modified Rankin (Stroke Patients Only)       Balance Overall balance assessment: No apparent balance deficits (not formally assessed)                            High level balance activites: Head turns;Sudden stops;Direction changes;Turns High Level Balance Comments: no LOB noted with high level balance activities              Pertinent Vitals/Pain Pain Assessment: No/denies pain    Home Living Family/patient expects to be discharged to:: Private residence Living Arrangements: Alone Available Help at Discharge: Family;Available 24 hours/day Type of Home: House Home Access: Level entry     Home Layout: One level Home Equipment: None Additional Comments: usually lives alone; will D/C with daughter     Prior Function Level of Independence: Independent         Comments: very active. drives and lives alone     Hand Dominance        Extremity/Trunk Assessment   Upper Extremity Assessment: Defer to OT evaluation           Lower Extremity Assessment: Overall WFL for tasks assessed      Cervical / Trunk Assessment: Normal  Communication   Communication: No difficulties  Cognition Arousal/Alertness: Awake/alert Behavior During Therapy: WFL for tasks assessed/performed Overall Cognitive Status: Within Functional Limits for tasks assessed                      General Comments General comments (skin integrity, edema, etc.): encouraged ambulation with family as tolerated to maintain activity tolerance    Exercises        Assessment/Plan    PT Assessment  Patent does not need any further PT services  PT Diagnosis Difficulty walking   PT Problem List    PT Treatment Interventions     PT Goals (Current goals can be found in the Care Plan section) Acute Rehab PT Goals Patient Stated Goal: home tomorrow PT Goal Formulation: All assessment and education complete, DC therapy    Frequency     Barriers to discharge        Co-evaluation               End of Session   Activity Tolerance: Patient tolerated treatment well Patient left: in bed;with call bell/phone within  reach;with family/visitor present;Other (comment);with nursing/sitter in room (sitting EOB) Nurse Communication: Mobility status         Time: 4098-1191 PT Time Calculation (min) (ACUTE ONLY): 18 min   Charges:   PT Evaluation $Initial PT Evaluation Tier I: 1 Procedure PT Treatments $Gait Training: 8-22 mins   PT G CodesDonell Sievert, [place] [phone] 07/05/2014, 2:44 PM

## 2014-07-05 NOTE — Progress Notes (Signed)
Patient complaining of fibromyalgia flare, states "it's a sharp shooting pain everywhere." Patient states no relief from tramadol alone, per daughter "They aren't giving her flexeril because of her liver tests." RN spoke with pharmacy who stated robaxin could be an alternative, Claiborne Billingsallahan, NP notified. One time dose of robaxin 500 mg PO placed .

## 2014-07-05 NOTE — Progress Notes (Signed)
PROGRESS NOTE  Colleen Cooper ZOX:[id number] DOB: 20-Jul-1940 DOA: 07/02/2014 PCP: Sherlene Shams, MD  HPI/Recap of past 24 hours: Patient is 74 year old female in usual good health who in the last 9 days started having progressively worsening cough, loss of appetite and weakness with diarrhea starting approximately one to 2 days prior and was admitted on 1/7 for sepsis.  Initially concerns of a back side rash for shingles, and patient started on Zovirax and had varicella zoster workup.  Over the next few days, patient has continued to do well with cough and breathing easier. Blood pressure better. Repeat chest x-ray noted severe left lower no pneumonia once hydrated and antibiotics changed to Rocephin/Citrobacter max. Patient noted to have low-grade temp overnight so CT scan ordered for better evaluation.  Patient sleeping well today and comfortable. No complaints.  Assessment/Plan: Principal Problem:   Sepsis with acute respiratory failure and likely pneumonia: Resolved. Patient noted be hypotensive despite fluid resuscitation. Blood cultures pending. White count has stayed normal. After 2 days of fluid resuscitation, pressure soft, but patient asymptomatic. Not orthostatic. Suspect that she is chronically hypotensive, special settings of some of her chronic medications for fibromyalgia. Given hydration, x-ray repeated and this time noting severe left lower lobe pneumonia.  Given low-grade temp after switching to focal antibiotics, checking CT scan Active Problems:    Transaminasemia: Continues to improve. Likely impart shock liver although patient does have history of fatty liver and noted some gallbladder sludge on ultrasound    Diarrhea: Does not look to be infectious. Question source?Marland Kitchen No episodes since admission. May have been from gallbladder sludge.  Fibromyalgia: Patient on chronic Ultram and Neurontin.  Will try adding Elavil at night for overall better control  Fatty liver: As  above Code Status: Full code  Family Communication: Daughter at the bedside  Disposition Plan: Follow-up CT, likely discharge in next 1-2 days   Consultants:  None  Procedures:  None  Antibiotics:  IV vancomycin 1/7-1/9  IV Zosyn 1/7-1/9  IV Zithromax 1/9-present  IV Rocephin 1/9-present   Objective: BP 123/52 mmHg  Pulse 83  Temp(Src) 100.3 F (37.9 C) (Oral)  Resp 17  Ht  (1.626 m)  Wt 66.225 kg (146 lb)  BMI 25.05 kg/m2  SpO2 93%  Intake/Output Summary (Last 24 hours) at 07/05/14 0954 Last data filed at 07/05/14 0718  Gross per 24 hour  Intake    650 ml  Output   1100 ml  Net   -450 ml   Filed Weights   07/02/14 1720  Weight: 66.225 kg (146 lb)    Exam:   General:  Resting comfortably  Cardiovascular: Regular rate and rhythm, S1-S2  Respiratory: Clear to auscultation bilaterally  Abdomen: Soft, nontender, nondistended, normoactive bowel sounds  Musculoskeletal: No clubbing or cyanosis or edema   Data Reviewed: Basic Metabolic Panel:  Recent Labs Lab 07/02/14 1333 07/03/14 0054 07/03/14 1611 07/04/14 0304 07/05/14 0540  NA 129* 132*  --  137 136  K 3.3* 2.5*  --  4.1 4.1  CL 94* 101  --  110 105  CO2 19 20  --  20 23  GLUCOSE 99 114*  --  90 97  BUN 10 12  --  <5* <5*  CREATININE 1.05 1.01  --  0.80 0.82  CALCIUM 8.4 8.0*  --  7.7* 8.1*  MG  --   --  1.9  --   --    Liver Function Tests:  Recent Labs Lab 07/02/14 1333  07/03/14 0054 07/04/14 0304 07/05/14 0540  AST 383* 301* 332* 165*  ALT 220* 186* 196* 139*  ALKPHOS 89 71 112 129*  BILITOT 1.1 0.8 0.9 0.6  PROT 6.4 5.2* 4.9* 4.8*  ALBUMIN 3.0* 2.5* 2.2* 2.0*    Recent Labs Lab 07/02/14 1512  LIPASE 46   No results for input(s): AMMONIA in the last 168 hours. CBC:  Recent Labs Lab 07/02/14 1333 07/03/14 0054 07/04/14 0304  WBC 3.6* 2.9* 2.9*  NEUTROABS 2.8  --   --   HGB 11.2* 10.6* 10.0*  HCT 31.9* 29.8* 29.5*  MCV 85.5 85.4 87.5  PLT 197 186  204   Cardiac Enzymes:    Recent Labs Lab 07/03/14 0054 07/03/14 0743 07/03/14 1614  TROPONINI <0.03 0.03 <0.03   BNP (last 3 results) No results for input(s): PROBNP in the last 8760 hours. CBG: No results for input(s): GLUCAP in the last 168 hours.  Recent Results (from the past 240 hour(s))  Blood culture (routine x 2)     Status: None (Preliminary result)   Collection Time: 07/02/14  2:04 PM  Result Value Ref Range Status   Specimen Description BLOOD RIGHT ARM  Final   Special Requests BOTTLES DRAWN AEROBIC AND ANAEROBIC  Final   Culture   Final           BLOOD CULTURE RECEIVED NO GROWTH TO DATE CULTURE WILL BE HELD FOR 5 DAYS BEFORE ISSUING A FINAL NEGATIVE REPORT Performed at [facility]    Report Status PENDING  Incomplete  Blood culture (routine x 2)     Status: None (Preliminary result)   Collection Time: 07/02/14  2:54 PM  Result Value Ref Range Status   Specimen Description BLOOD HAND RIGHT  Final   Special Requests BOTTLES DRAWN AEROBIC AND ANAEROBIC 5CC  Final   Culture   Final           BLOOD CULTURE RECEIVED NO GROWTH TO DATE CULTURE WILL BE HELD FOR 5 DAYS BEFORE ISSUING A FINAL NEGATIVE REPORT Performed at [facility]    Report Status PENDING  Incomplete  Urine culture     Status: None   Collection Time: 07/02/14  9:13 PM  Result Value Ref Range Status   Specimen Description URINE, CLEAN CATCH  Final   Special Requests NONE  Final   Colony Count NO GROWTH Performed at [facility]   Final   Culture NO GROWTH Performed at [facility]   Final   Report Status 07/04/2014 FINAL  Final  MRSA PCR Screening     Status: None   Collection Time: 07/03/14  1:35 PM  Result Value Ref Range Status   MRSA by PCR NEGATIVE NEGATIVE Final    Comment:        The GeneXpert MRSA Assay (FDA approved for NASAL specimens only), is one component of a comprehensive MRSA colonization surveillance program. It is not intended to  diagnose MRSA infection nor to guide or monitor treatment for MRSA infections.      Studies: Dg Chest 2 View  07/04/2014   CLINICAL DATA:  74 year old female with shortness of breath. Possible pneumonia.  EXAM: CHEST  2 VIEW  COMPARISON:  Chest x-ray 07/02/2014.  FINDINGS: Extensive airspace consolidation in the left lower lobe, concerning for bronchopneumonia. Mild diffuse peribronchial cuffing. No definite pleural effusions. No evidence of pulmonary edema. Heart size is normal. Upper mediastinal contours are within normal limits.  IMPRESSION: 1. Findings are again suspicious for  bronchitis, however, there is now what appears to be a severe left lower lobe bronchopneumonia.   Electronically Signed   By: Trudie Reedaniel  Entrikin M.D.   On: 07/04/2014 16:17    Scheduled Meds: . amitriptyline  25 mg Oral QHS  . azithromycin  500 mg Intravenous Q24H  . cefTRIAXone (ROCEPHIN)  IV  1 g Intravenous Q24H  . enoxaparin (LOVENOX) injection  40 mg Subcutaneous Daily  . gabapentin  100 mg Oral BID  . pantoprazole  40 mg Oral Daily  . sodium chloride  3 mL Intravenous Q12H    Continuous Infusions: . sodium chloride 0.9 % 1,000 mL with potassium chloride 20 mEq infusion 10 mL/hr at 07/04/14 1704     Time spent: 25 minutes  Hollice EspyKRISHNAN,Hildagarde Holleran K  [facility] Pager [phone]. If 7PM-7AM, please contact night-coverage at [email or web address], password [email or web address] 07/05/2014, 9:54 AM  LOS: 3 days

## 2014-07-05 NOTE — Progress Notes (Signed)
*  Preliminary Results* Bilateral lower extremity venous duplex completed. Bilateral lower extremities are negative for deep vein thrombosis. There is no evidence of Baker's cyst bilaterally.  07/05/2014  Melrose Kearse, RVT, RDCS, RDMS  

## 2014-07-05 NOTE — Progress Notes (Signed)
PT Cancellation Note  Patient Details Name: Colleen Cooper MRN: [id number] DOB: 06-13-1941   Cancelled Treatment:    Reason Eval/Treat Not Completed: Patient at procedure or test/unavailable. Pt off floor at CT scan. Will re-attempt at next available time.    Donnamarie PoagWest, Macyn Remmert CrawfordN, [place]  [phone] 07/05/2014, 9:47 AM

## 2014-07-06 DIAGNOSIS — K828 Other specified diseases of gallbladder: Secondary | ICD-10-CM

## 2014-07-06 LAB — COMPREHENSIVE METABOLIC PANEL
ALBUMIN: 2.2 g/dL — AB (ref 3.5–5.2)
ALT: 120 U/L — ABNORMAL HIGH (ref 0–35)
ANION GAP: 10 (ref 5–15)
AST: 139 U/L — AB (ref 0–37)
Alkaline Phosphatase: 120 U/L — ABNORMAL HIGH (ref 39–117)
BUN: <5 mg/dL — ABNORMAL LOW (ref 6–23)
CO2: 21 mmol/L (ref 19–32)
CREATININE: 0.67 mg/dL (ref 0.50–1.10)
Calcium: 8 mg/dL — ABNORMAL LOW (ref 8.4–10.5)
Chloride: 105 mEq/L (ref 96–112)
GFR calc Af Amer: >90 mL/min (ref >90)
GFR, EST NON AFRICAN AMERICAN: 86 mL/min — AB (ref >90)
Glucose, Bld: 96 mg/dL (ref 70–99)
POTASSIUM: 3.6 mmol/L (ref 3.5–5.1)
Sodium: 136 mmol/L (ref 135–145)
TOTAL PROTEIN: 4.7 g/dL — AB (ref 6.0–8.3)
Total Bilirubin: 1.1 mg/dL (ref 0.3–1.2)

## 2014-07-06 MED ORDER — LEVOFLOXACIN 750 MG PO TABS: 750.0000 mg | ORAL_TABLET | Freq: Every day | ORAL | Status: AC

## 2014-07-06 MED ORDER — TRAMADOL HCL 50 MG PO TABS: 50.0000 mg | ORAL_TABLET | Freq: Three times a day (TID) | ORAL | Status: DC | PRN

## 2014-07-06 MED ORDER — BENZONATATE 100 MG PO CAPS: 100.0000 mg | ORAL_CAPSULE | Freq: Two times a day (BID) | ORAL | Status: DC

## 2014-07-06 MED ORDER — AMITRIPTYLINE HCL 25 MG PO TABS: 25.0000 mg | ORAL_TABLET | Freq: Every day | ORAL | Status: DC

## 2014-07-06 NOTE — Discharge Summary (Signed)
Discharge Summary  Colleen Cooper WNI:[id number] DOB: 05-Jun-1941  PCP: Arlis Porta, MD  Admit date: 07/02/2014 Discharge date: 07/06/2014  Time spent: 25 minutes  Recommendations for Outpatient Follow-up:  1. New medications: Levaquin 750 mg by mouth daily 6 more days 2. New medications: Elavil 25 mg by mouth daily at bedtime to be started once Levaquin finished 3. Patient will need to have repeat CT scan of chest in 4-6 weeks 4. Patient will follow-up with her primary care physician in the next 2-3 weeks   Discharge Diagnoses:  Active Hospital Problems   Diagnosis Date Noted  . Sepsis 07/02/2014  . CAP (community acquired pneumonia) 07/05/2014  . Diarrhea 07/03/2014  . Acute respiratory failure 07/02/2014  . Atypical chest pain 07/02/2014  . Transaminasemia 07/02/2014  . Leukopenia 07/02/2014    Resolved Hospital Problems   Diagnosis Date Noted Date Resolved  No resolved problems to display.    Discharge Condition: Improved, being discharged home  Diet recommendation: Regular diet  Filed Weights   07/02/14 1720  Weight: 66.225 kg (146 lb)    History of present illness:  Patient is 74 year old female in usual good health who in the last 9 days started having progressively worsening cough, loss of appetite and weakness with diarrhea starting approximately one to 2 days prior and was admitted on 1/7 for sepsis. Initially concerns of a back side rash for shingles, and patient started on Zovirax and had varicella zoster workup.(However on follow-up exam, patient was pain free in this area and looked to be more from skin irritation from a localized pain patch)  Hospital Course:  Principal Problem:   Sepsis with acute respiratory failure and pneumonia: Patient met criteria given fevers, symptoms, lung source and hypotension not immediately resolved with fluid resuscitation: Patient took a large amount of fluid resuscitation to restore blood pressure volumes. After  several days, patient's cough and breathing became easier. Blood pressure improved. Initial chest x-ray unrevealing, but repeat chest x-ray after hydration noted severe left lower lobe pneumonia. A follow-up CT scan noted significant multilobar pneumonia. Following improvement, patient changed to by mouth antibiotics and she'll be discharged on 6 more days for total of 10 days of therapy. Repeat CT scan recommended in 4-6 weeks to ensure full resolution Active Problems:    Atypical chest pain: Resolved  Soon after admission. Almost certainly from heavy coughing   Transaminasemia: Patient with elevated LFTs on admission. This is most likely from shock liver. Subsequent lab checks following hydration noted LFTs continuing to trend down. It was noted on abdominal ultrasound the patient does have some fatty liver and this should be followed long-term by her primary care physician.  Fatty liver: As above Gallbladder sludge: As noted below.    Diarrhea: Patient diarrhea only occurred for 2 days prior to admission and no episodes during the hospital. May been loose stools. Patient did have some gallbladder sludge on abdominal ultrasound, but currently lab so no evidence of impacted gallbladder at this time.    CAP (community acquired pneumonia)  fibromyalgia: Patient states that she has treated this in the past with when necessary Ultram, which she takes premature every morning. I discussed with her and her family about the benefits that she may see some better benefit with a nightly Elavil dose and less flareups. Started in the hospital and can be titrated up to 59 g daily at bedtime. Patient will restart this medication after her Levaquin as completed given interaction which was Levaquin,  Elavil and Ultram  Procedures:  None  Consultations:  None  Discharge Exam: BP 116/58 mmHg  Pulse 72  Temp(Src) 98.2 F (36.8 C) (Oral)  Resp 17  Ht 5' 4"  (1.626 m)  Wt 66.225 kg (146 lb)  BMI 25.05 kg/m2   SpO2 94%  General: Alert and oriented 3, much improved Cardiovascular: Regular rate and rhythm, S1-S2 Respiratory: Clear to auscultation bilaterally  Discharge Instructions You were cared for by a hospitalist during your hospital stay. If you have any questions about your discharge medications or the care you received while you were in the hospital after you are discharged, you can call the unit and asked to speak with the hospitalist on call if the hospitalist that took care of you is not available. Once you are discharged, your primary care physician will handle any further medical issues. Please note that NO REFILLS for any discharge medications will be authorized once you are discharged, as it is imperative that you return to your primary care physician (or establish a relationship with a primary care physician if you do not have one) for your aftercare needs so that they can reassess your need for medications and monitor your lab values.  Discharge Instructions    Diet general    Complete by:  As directed      Increase activity slowly    Complete by:  As directed             Medication List    STOP taking these medications        azithromycin 250 MG tablet  Commonly known as:  ZITHROMAX     cyclobenzaprine 5 MG tablet  Commonly known as:  FLEXERIL     DULoxetine 30 MG capsule  Commonly known as:  CYMBALTA     estradiol 1 MG tablet  Commonly known as:  ESTRACE     Flurbiprofen-Baclofen-Lido 15-4-5 % Crea     fluticasone 50 MCG/ACT nasal spray  Commonly known as:  FLONASE      TAKE these medications        amitriptyline 25 MG tablet  Commonly known as:  ELAVIL  Take 1 tablet (25 mg total) by mouth at bedtime.     benzonatate 100 MG capsule  Commonly known as:  TESSALON  Take 1 capsule (100 mg total) by mouth 2 (two) times daily.     guaifenesin 100 MG/5ML syrup  Commonly known as:  ROBITUSSIN  Take 200 mg by mouth 3 (three) times daily as needed for cough.       ibuprofen 400 MG tablet  Commonly known as:  ADVIL,MOTRIN  Take 400 mg by mouth every 6 (six) hours as needed for fever or headache.     levofloxacin 750 MG tablet  Commonly known as:  LEVAQUIN  Take 1 tablet (750 mg total) by mouth daily.     Tdap 5-2.5-18.5 LF-MCG/0.5 injection  Commonly known as:  BOOSTRIX  Inject 0.5 mLs into the muscle once.     traMADol 50 MG tablet  Commonly known as:  ULTRAM  Take 1 tablet (50 mg total) by mouth every 8 (eight) hours as needed.       No Known Allergies     Follow-up Information    Follow up with TULLO, TERESA L, MD In 3 weeks.   Specialty:  Internal Medicine   Contact information:   [address] [place] [address] [phone]        The results of significant  diagnostics from this hospitalization (including imaging, microbiology, ancillary and laboratory) are listed below for reference.    Significant Diagnostic Studies: Dg Chest 2 View  07/04/2014   CLINICAL DATA:  74 year old female with shortness of breath. Possible pneumonia.  EXAM: CHEST  2 VIEW  COMPARISON:  Chest x-ray 07/02/2014.  FINDINGS: Extensive airspace consolidation in the left lower lobe, concerning for bronchopneumonia. Mild diffuse peribronchial cuffing. No definite pleural effusions. No evidence of pulmonary edema. Heart size is normal. Upper mediastinal contours are within normal limits.  IMPRESSION: 1. Findings are again suspicious for bronchitis, however, there is now what appears to be a severe left lower lobe bronchopneumonia.   Electronically Signed   By: Vinnie Langton M.D.   On: 07/04/2014 16:17   Dg Chest 2 View  07/02/2014   CLINICAL DATA:  Approximate 1-1/2 week history of cough, chills and headache. Fever of 102 degrees F. Possible shingles involving the right side of the back.  EXAM: CHEST  2 VIEW  COMPARISON:  None.  FINDINGS: Cardiomediastinal silhouette unremarkable. Mildly prominent bronchovascular markings diffusely and mild  central peribronchial thickening. Lungs otherwise clear. No localized airspace consolidation. No pleural effusions. No pneumothorax. Normal pulmonary vascularity. Degenerative changes involving the thoracic spine and generalized osseous demineralization.  IMPRESSION: Mild changes of bronchitis and/or asthma without focal airspace pneumonia.   Electronically Signed   By: Evangeline Dakin M.D.   On: 07/02/2014 14:37   Ct Chest W Contrast  07/05/2014   CLINICAL DATA:  Pneumonia, fever, cough.  EXAM: CT CHEST WITH CONTRAST  TECHNIQUE: Multidetector CT imaging of the chest was performed during intravenous contrast administration.  CONTRAST:  22m OMNIPAQUE IOHEXOL 300 MG/ML  SOLN  COMPARISON:  Chest radiograph 07/04/2014  FINDINGS: Visualized thyroid is unremarkable. 13 mm subcarinal lymph node. 22 mm left infrahilar lymph mass (image 39; series 2). 12 mm right hilar lymph node. Aorta and main pulmonary artery are normal in caliber.  Central airways are patent. Large area of consolidation within the left lower lobe with scattered areas of consolidation in the right lower lobe and right upper lobe. Surrounding ground-glass pulmonary opacities. Small bilateral pleural effusions.  No pneumothorax.  Visualization of the upper abdomen demonstrates a small amount of perisplenic fluid. The common bile duct is mildly dilated measuring up to 9 mm. No aggressive or acute appearing osseous lesions.  IMPRESSION: Extensive bilateral predominantly lower lobe consolidative opacities with surrounding ground-glass attenuation. Additionally there are focal areas of consolidation within the right upper lobe. Overall findings are most compatible with multi focal pneumonia.  Left infrahilar soft tissue mass is nonspecific however favored to represent adenopathy, likely reactive given the extensive pulmonary infectious process. Additional mediastinal and right hilar adenopathy.  Given the above findings, recommend follow-up chest CT in 4-  6 weeks after the resolution of acute symptomatology to evaluate for interval improvement/resolution.  Small amount of perisplenic fluid.  Nonspecific dilatation of the common bile duct. Recommend correlation with laboratory analysis.   Electronically Signed   By: DLovey NewcomerM.D.   On: 07/05/2014 12:17   [place]dom[place] Limited Ruq  07/02/2014   CLINICAL DATA:  74year old female with abnormal liver enzymes. Initial encounter.  EXAM: [place]DOMEN LIMITED - RIGHT UPPER QUADRANT  COMPARISON:  Abdomen ultrasound 08/05/2013.  FINDINGS: Gallbladder:  Dependent sludge in the gallbladder neck. Suggestion of shadowing gallstones (image 25), reportedly mobile during real-time scanning. Gallbladder wall thickness is normal at 2 mm. No sonographic Murphy sign elicited. No pericholecystic fluid.  Common bile duct:  Diameter: 3 mm, normal  Liver:  Increased liver echogenicity again noted (image 10). No discrete liver lesion. No intrahepatic biliary ductal dilatation.  Other findings: Negative visible right kidney, and visible pancreas  IMPRESSION: 1. Cholelithiasis without evidence of acute cholecystitis. 2. Chronic hepatic steatosis.   Electronically Signed   By: Lars Pinks M.D.   On: 07/02/2014 16:25    Microbiology: Recent Results (from the past 240 hour(s))  Blood culture (routine x 2)     Status: None (Preliminary result)   Collection Time: 07/02/14  2:04 PM  Result Value Ref Range Status   Specimen Description BLOOD RIGHT ARM  Final   Special Requests BOTTLES DRAWN AEROBIC AND ANAEROBIC 5MLS  Final   Culture   Final           BLOOD CULTURE RECEIVED NO GROWTH TO DATE CULTURE WILL BE HELD FOR 5 DAYS BEFORE ISSUING A FINAL NEGATIVE REPORT Performed at [facility]    Report Status PENDING  Incomplete  Blood culture (routine x 2)     Status: None (Preliminary result)   Collection Time: 07/02/14  2:54 PM  Result Value Ref Range Status   Specimen Description BLOOD HAND RIGHT  Final   Special Requests  BOTTLES DRAWN AEROBIC AND ANAEROBIC 5CC  Final   Culture   Final           BLOOD CULTURE RECEIVED NO GROWTH TO DATE CULTURE WILL BE HELD FOR 5 DAYS BEFORE ISSUING A FINAL NEGATIVE REPORT Performed at [facility]    Report Status PENDING  Incomplete  Urine culture     Status: None   Collection Time: 07/02/14  9:13 PM  Result Value Ref Range Status   Specimen Description URINE, CLEAN CATCH  Final   Special Requests NONE  Final   Colony Count NO GROWTH Performed at [facility]   Final   Culture NO GROWTH Performed at [facility]   Final   Report Status 07/04/2014 FINAL  Final  MRSA PCR Screening     Status: None   Collection Time: 07/03/14  1:35 PM  Result Value Ref Range Status   MRSA by PCR NEGATIVE NEGATIVE Final    Comment:        The GeneXpert MRSA Assay (FDA approved for NASAL specimens only), is one component of a comprehensive MRSA colonization surveillance program. It is not intended to diagnose MRSA infection nor to guide or monitor treatment for MRSA infections.      Labs: Basic Metabolic Panel:  Recent Labs Lab 07/02/14 1333 07/03/14 0054 07/03/14 1611 07/04/14 0304 07/05/14 0540 07/06/14 0458  NA 129* 132*  --  137 136 136  K 3.3* 2.5*  --  4.1 4.1 3.6  CL 94* 101  --  110 105 105  CO2 19 20  --  20 23 21   GLUCOSE 99 114*  --  90 97 96  BUN 10 12  --  <5* <5* <5*  CREATININE 1.05 1.01  --  0.80 0.82 0.67  CALCIUM 8.4 8.0*  --  7.7* 8.1* 8.0*  MG  --   --  1.9  --   --   --    Liver Function Tests:  Recent Labs Lab 07/02/14 1333 07/03/14 0054 07/04/14 0304 07/05/14 0540 07/06/14 0458  AST 383* 301* 332* 165* 139*  ALT 220* 186* 196* 139* 120*  ALKPHOS 89 71 112 129* 120*  BILITOT 1.1 0.8 0.9 0.6 1.1  PROT 6.4 5.2* 4.9* 4.8*  4.7*  ALBUMIN 3.0* 2.5* 2.2* 2.0* 2.2*    Recent Labs Lab 07/02/14 1512  LIPASE 46   No results for input(s): AMMONIA in the last 168 hours. CBC:  Recent Labs Lab 07/02/14 1333  07/03/14 0054 07/04/14 0304  WBC 3.6* 2.9* 2.9*  NEUTROABS 2.8  --   --   HGB 11.2* 10.6* 10.0*  HCT 31.9* 29.8* 29.5*  MCV 85.5 85.4 87.5  PLT 197 186 204   Cardiac Enzymes:  Recent Labs Lab 07/03/14 0054 07/03/14 0743 07/03/14 1614  TROPONINI <0.03 0.03 <0.03   BNP: BNP (last 3 results) No results for input(s): PROBNP in the last 8760 hours. CBG: No results for input(s): GLUCAP in the last 168 hours.     Signed:  Annita Brod  [facility] 07/06/2014, 2:15 PM

## 2014-07-06 NOTE — Plan of Care (Signed)
Problem: Discharge Progression Outcomes Goal: Flu vaccine received if indicated Outcome: Not Applicable Date Met:  22/30/09 Pt. refuses Goal: Pneumonia vaccine received if indicated Outcome: Not Applicable Date Met:  79/49/97 Pt. Had in 2014

## 2014-07-06 NOTE — Progress Notes (Signed)
Colleen Cooper discharged Home with daughter per MD order.  Discharge instructions reviewed and discussed with the patient & pt. daughter, all questions and concerns answered. Copy of instructions, care notes for new diagnosis & medications given to patient, scripts sent to pharmacy.    Medication List    STOP taking these medications        azithromycin 250 MG tablet  Commonly known as:  ZITHROMAX     cyclobenzaprine 5 MG tablet  Commonly known as:  FLEXERIL     DULoxetine 30 MG capsule  Commonly known as:  CYMBALTA     estradiol 1 MG tablet  Commonly known as:  ESTRACE     Flurbiprofen-Baclofen-Lido 15-4-5 % Crea     fluticasone 50 MCG/ACT nasal spray  Commonly known as:  FLONASE      TAKE these medications        amitriptyline 25 MG tablet  Commonly known as:  ELAVIL  Take 1 tablet (25 mg total) by mouth at bedtime.     benzonatate 100 MG capsule  Commonly known as:  TESSALON  Take 1 capsule (100 mg total) by mouth 2 (two) times daily.     guaifenesin 100 MG/5ML syrup  Commonly known as:  ROBITUSSIN  Take 200 mg by mouth 3 (three) times daily as needed for cough.     ibuprofen 400 MG tablet  Commonly known as:  ADVIL,MOTRIN  Take 400 mg by mouth every 6 (six) hours as needed for fever or headache.     levofloxacin 750 MG tablet  Commonly known as:  LEVAQUIN  Take 1 tablet (750 mg total) by mouth daily.     Tdap 5-2.5-18.5 LF-MCG/0.5 injection  Commonly known as:  BOOSTRIX  Inject 0.5 mLs into the muscle once.     traMADol 50 MG tablet  Commonly known as:  ULTRAM  Take 1 tablet (50 mg total) by mouth every 8 (eight) hours as needed.        Patients skin is clean, dry and intact, no evidence of skin break down. IV site discontinued and catheter remains intact. Site without signs and symptoms of complications. Dressing and pressure applied.  Patient escorted to car by NT in a wheelchair,  no distress noted upon discharge.  Colleen Cooper, Colleen Cooper 07/06/2014  11:58 AM

## 2014-07-06 NOTE — Discharge Instructions (Addendum)
Dr.Tullo will arrange for a repeat CT scan of your chest in 4-6 weeks  Sepsis Sepsis is a serious infection of your blood or tissues that affects your whole body. The infection that causes sepsis may be bacterial, viral, fungal, or parasitic. Sepsis may be life threatening. Sepsis can cause your blood pressure to drop. This may result in shock. Shock causes your central nervous system and your organs to stop working correctly.  RISK FACTORS Sepsis can happen in anyone, but it is more likely to happen in people who have weakened immune systems. SIGNS AND SYMPTOMS  Symptoms of sepsis can include:  Fever or low body temperature (hypothermia).  Rapid breathing (hyperventilation).  Chills.  Rapid heartbeat (tachycardia).  Confusion or light-headedness.  Trouble breathing.  Urinating much less than usual.  Cool, clammy skin or red, flushed skin.  Other problems with the heart, kidneys, or brain. DIAGNOSIS  Your health care provider will likely do tests to look for an infection, to see if the infection has spread to your blood, and to see how serious your condition is. Tests can include:  Blood tests, including cultures of your blood.  Cultures of other fluids from your body, such as:  Urine.  Pus from wounds.  Mucus coughed up from your lungs.  Urine tests other than cultures.  X-ray exams or other imaging tests. TREATMENT  Treatment will begin with elimination of the source of infection. If your sepsis is likely caused by a bacterial or fungal infection, you will be given antibiotic or antifungal medicines. You may also receive:  Oxygen.  Fluids through an IV tube.  Medicines to increase your blood pressure.  A machine to clean your blood (dialysis) if your kidneys fail.  A machine to help you breathe if your lungs fail. SEEK IMMEDIATE MEDICAL CARE IF: You get an infection or develop any of the signs and symptoms of sepsis after surgery or a  hospitalization. Document Released: 03/11/2003 Document Revised: 06/17/2013 Document Reviewed: 02/17/2013 [facility] Patient Information 2015 [employer] [place]. This information is not intended to replace advice given to you by your health care provider. Make sure you discuss any questions you have with your health care provider.

## 2014-07-07 LAB — RESPIRATORY VIRUS PANEL
Adenovirus: NOT DETECTED
INFLUENZA A H3: NOT DETECTED
INFLUENZA A: NOT DETECTED
INFLUENZA B 1: NOT DETECTED
Influenza A H1: NOT DETECTED
Metapneumovirus: NOT DETECTED
PARAINFLUENZA 3 A: NOT DETECTED
Parainfluenza 1: NOT DETECTED
Parainfluenza 2: NOT DETECTED
Respiratory Syncytial Virus A: NOT DETECTED
Respiratory Syncytial Virus B: NOT DETECTED
Rhinovirus: NOT DETECTED

## 2014-07-07 LAB — EPSTEIN BARR VRS(EBV DNA BY PCR): EBV DNA QN by PCR: <200 copies/mL

## 2014-07-08 LAB — CULTURE, BLOOD (ROUTINE X 2)
CULTURE: NO GROWTH
CULTURE: NO GROWTH

## 2014-07-14 DIAGNOSIS — M797 Fibromyalgia: Secondary | ICD-10-CM | POA: Diagnosis not present

## 2014-07-16 DIAGNOSIS — Z87891 Personal history of nicotine dependence: Secondary | ICD-10-CM | POA: Diagnosis not present

## 2014-07-16 DIAGNOSIS — Z8701 Personal history of pneumonia (recurrent): Secondary | ICD-10-CM | POA: Diagnosis not present

## 2014-07-16 DIAGNOSIS — R202 Paresthesia of skin: Secondary | ICD-10-CM | POA: Diagnosis not present

## 2014-07-16 DIAGNOSIS — M25512 Pain in left shoulder: Secondary | ICD-10-CM | POA: Diagnosis not present

## 2014-07-16 DIAGNOSIS — S199XXA Unspecified injury of neck, initial encounter: Secondary | ICD-10-CM | POA: Diagnosis not present

## 2014-07-16 DIAGNOSIS — R0602 Shortness of breath: Secondary | ICD-10-CM | POA: Diagnosis not present

## 2014-07-16 DIAGNOSIS — R509 Fever, unspecified: Secondary | ICD-10-CM | POA: Diagnosis not present

## 2014-07-16 DIAGNOSIS — M797 Fibromyalgia: Secondary | ICD-10-CM | POA: Diagnosis not present

## 2014-07-16 DIAGNOSIS — K5900 Constipation, unspecified: Secondary | ICD-10-CM | POA: Diagnosis not present

## 2014-07-16 DIAGNOSIS — Z79899 Other long term (current) drug therapy: Secondary | ICD-10-CM | POA: Diagnosis not present

## 2014-07-20 DIAGNOSIS — R531 Weakness: Secondary | ICD-10-CM | POA: Diagnosis not present

## 2014-07-27 ENCOUNTER — Ambulatory Visit: Payer: Self-pay | Admitting: Family Medicine

## 2014-07-27 DIAGNOSIS — Z1389 Encounter for screening for other disorder: Secondary | ICD-10-CM | POA: Diagnosis not present

## 2014-07-27 DIAGNOSIS — R918 Other nonspecific abnormal finding of lung field: Secondary | ICD-10-CM | POA: Diagnosis not present

## 2014-07-27 DIAGNOSIS — J9690 Respiratory failure, unspecified, unspecified whether with hypoxia or hypercapnia: Secondary | ICD-10-CM | POA: Diagnosis not present

## 2014-07-27 DIAGNOSIS — J189 Pneumonia, unspecified organism: Secondary | ICD-10-CM | POA: Diagnosis not present

## 2014-07-27 DIAGNOSIS — Z9181 History of falling: Secondary | ICD-10-CM | POA: Diagnosis not present

## 2014-07-27 DIAGNOSIS — K551 Chronic vascular disorders of intestine: Secondary | ICD-10-CM | POA: Diagnosis not present

## 2014-07-27 DIAGNOSIS — M797 Fibromyalgia: Secondary | ICD-10-CM | POA: Diagnosis not present

## 2014-07-27 DIAGNOSIS — F329 Major depressive disorder, single episode, unspecified: Secondary | ICD-10-CM | POA: Diagnosis not present

## 2014-07-27 DIAGNOSIS — R05 Cough: Secondary | ICD-10-CM | POA: Diagnosis not present

## 2014-08-04 DIAGNOSIS — R202 Paresthesia of skin: Secondary | ICD-10-CM | POA: Diagnosis not present

## 2014-08-04 DIAGNOSIS — M79601 Pain in right arm: Secondary | ICD-10-CM | POA: Diagnosis not present

## 2014-08-04 DIAGNOSIS — N319 Neuromuscular dysfunction of bladder, unspecified: Secondary | ICD-10-CM | POA: Diagnosis not present

## 2014-08-04 DIAGNOSIS — M6281 Muscle weakness (generalized): Secondary | ICD-10-CM | POA: Diagnosis not present

## 2014-08-07 DIAGNOSIS — J181 Lobar pneumonia, unspecified organism: Secondary | ICD-10-CM | POA: Diagnosis not present

## 2014-08-07 DIAGNOSIS — M792 Neuralgia and neuritis, unspecified: Secondary | ICD-10-CM | POA: Diagnosis not present

## 2014-08-22 ENCOUNTER — Ambulatory Visit: Payer: Self-pay | Admitting: Neurology

## 2014-08-22 DIAGNOSIS — M47812 Spondylosis without myelopathy or radiculopathy, cervical region: Secondary | ICD-10-CM | POA: Diagnosis not present

## 2014-08-22 DIAGNOSIS — M5032 Other cervical disc degeneration, mid-cervical region: Secondary | ICD-10-CM | POA: Diagnosis not present

## 2014-08-22 DIAGNOSIS — M4802 Spinal stenosis, cervical region: Secondary | ICD-10-CM | POA: Diagnosis not present

## 2014-08-22 DIAGNOSIS — G959 Disease of spinal cord, unspecified: Secondary | ICD-10-CM | POA: Diagnosis not present

## 2014-08-25 DIAGNOSIS — E785 Hyperlipidemia, unspecified: Secondary | ICD-10-CM | POA: Diagnosis not present

## 2014-08-25 DIAGNOSIS — R0989 Other specified symptoms and signs involving the circulatory and respiratory systems: Secondary | ICD-10-CM | POA: Diagnosis not present

## 2014-08-25 DIAGNOSIS — K551 Chronic vascular disorders of intestine: Secondary | ICD-10-CM | POA: Diagnosis not present

## 2014-08-25 DIAGNOSIS — I771 Stricture of artery: Secondary | ICD-10-CM | POA: Diagnosis not present

## 2014-08-27 DIAGNOSIS — N319 Neuromuscular dysfunction of bladder, unspecified: Secondary | ICD-10-CM | POA: Diagnosis not present

## 2014-08-27 DIAGNOSIS — R3915 Urgency of urination: Secondary | ICD-10-CM | POA: Diagnosis not present

## 2014-08-31 DIAGNOSIS — M79601 Pain in right arm: Secondary | ICD-10-CM | POA: Diagnosis not present

## 2014-08-31 DIAGNOSIS — M6281 Muscle weakness (generalized): Secondary | ICD-10-CM | POA: Diagnosis not present

## 2014-08-31 DIAGNOSIS — R202 Paresthesia of skin: Secondary | ICD-10-CM | POA: Diagnosis not present

## 2014-08-31 DIAGNOSIS — M79602 Pain in left arm: Secondary | ICD-10-CM | POA: Diagnosis not present

## 2014-09-02 DIAGNOSIS — K449 Diaphragmatic hernia without obstruction or gangrene: Secondary | ICD-10-CM | POA: Diagnosis not present

## 2014-09-02 DIAGNOSIS — F329 Major depressive disorder, single episode, unspecified: Secondary | ICD-10-CM | POA: Diagnosis not present

## 2014-09-02 DIAGNOSIS — M797 Fibromyalgia: Secondary | ICD-10-CM | POA: Diagnosis not present

## 2014-09-02 DIAGNOSIS — M792 Neuralgia and neuritis, unspecified: Secondary | ICD-10-CM | POA: Diagnosis not present

## 2014-09-02 DIAGNOSIS — E538 Deficiency of other specified B group vitamins: Secondary | ICD-10-CM | POA: Diagnosis not present

## 2014-09-03 DIAGNOSIS — E538 Deficiency of other specified B group vitamins: Secondary | ICD-10-CM | POA: Diagnosis not present

## 2014-09-04 ENCOUNTER — Encounter: Payer: Self-pay | Admitting: Neurology

## 2014-09-08 ENCOUNTER — Ambulatory Visit: Payer: Self-pay | Admitting: Vascular Surgery

## 2014-09-08 DIAGNOSIS — I771 Stricture of artery: Secondary | ICD-10-CM | POA: Diagnosis not present

## 2014-09-08 DIAGNOSIS — K551 Chronic vascular disorders of intestine: Secondary | ICD-10-CM | POA: Diagnosis not present

## 2014-09-08 DIAGNOSIS — I708 Atherosclerosis of other arteries: Secondary | ICD-10-CM | POA: Diagnosis not present

## 2014-09-08 DIAGNOSIS — R1011 Right upper quadrant pain: Secondary | ICD-10-CM | POA: Diagnosis not present

## 2014-09-14 ENCOUNTER — Ambulatory Visit: Payer: Self-pay | Admitting: Vascular Surgery

## 2014-09-14 DIAGNOSIS — K2270 Barrett's esophagus without dysplasia: Secondary | ICD-10-CM | POA: Diagnosis not present

## 2014-09-14 DIAGNOSIS — R600 Localized edema: Secondary | ICD-10-CM | POA: Diagnosis not present

## 2014-09-14 DIAGNOSIS — Z79899 Other long term (current) drug therapy: Secondary | ICD-10-CM | POA: Diagnosis not present

## 2014-09-14 DIAGNOSIS — M797 Fibromyalgia: Secondary | ICD-10-CM | POA: Diagnosis not present

## 2014-09-14 DIAGNOSIS — R0989 Other specified symptoms and signs involving the circulatory and respiratory systems: Secondary | ICD-10-CM | POA: Diagnosis not present

## 2014-09-14 DIAGNOSIS — E785 Hyperlipidemia, unspecified: Secondary | ICD-10-CM | POA: Diagnosis not present

## 2014-09-14 DIAGNOSIS — I499 Cardiac arrhythmia, unspecified: Secondary | ICD-10-CM | POA: Diagnosis not present

## 2014-09-14 DIAGNOSIS — Z7901 Long term (current) use of anticoagulants: Secondary | ICD-10-CM | POA: Diagnosis not present

## 2014-09-14 DIAGNOSIS — I749 Embolism and thrombosis of unspecified artery: Secondary | ICD-10-CM | POA: Diagnosis not present

## 2014-09-14 DIAGNOSIS — Z87891 Personal history of nicotine dependence: Secondary | ICD-10-CM | POA: Diagnosis not present

## 2014-09-14 DIAGNOSIS — K551 Chronic vascular disorders of intestine: Secondary | ICD-10-CM | POA: Diagnosis not present

## 2014-09-14 DIAGNOSIS — I771 Stricture of artery: Secondary | ICD-10-CM | POA: Diagnosis not present

## 2014-09-15 ENCOUNTER — Ambulatory Visit: Payer: Self-pay | Admitting: Neurology

## 2014-09-15 DIAGNOSIS — R937 Abnormal findings on diagnostic imaging of other parts of musculoskeletal system: Secondary | ICD-10-CM | POA: Diagnosis not present

## 2014-09-15 DIAGNOSIS — M542 Cervicalgia: Secondary | ICD-10-CM | POA: Diagnosis not present

## 2014-09-16 DIAGNOSIS — M79609 Pain in unspecified limb: Secondary | ICD-10-CM | POA: Insufficient documentation

## 2014-09-16 DIAGNOSIS — M6281 Muscle weakness (generalized): Secondary | ICD-10-CM | POA: Insufficient documentation

## 2014-09-16 DIAGNOSIS — R531 Weakness: Secondary | ICD-10-CM | POA: Insufficient documentation

## 2014-10-02 DIAGNOSIS — R202 Paresthesia of skin: Secondary | ICD-10-CM | POA: Diagnosis not present

## 2014-10-02 DIAGNOSIS — R200 Anesthesia of skin: Secondary | ICD-10-CM | POA: Diagnosis not present

## 2014-10-06 DIAGNOSIS — Z5189 Encounter for other specified aftercare: Secondary | ICD-10-CM | POA: Diagnosis not present

## 2014-10-06 DIAGNOSIS — R5383 Other fatigue: Secondary | ICD-10-CM | POA: Diagnosis not present

## 2014-10-06 DIAGNOSIS — Z91018 Allergy to other foods: Secondary | ICD-10-CM | POA: Diagnosis not present

## 2014-10-06 DIAGNOSIS — R200 Anesthesia of skin: Secondary | ICD-10-CM | POA: Diagnosis not present

## 2014-10-07 ENCOUNTER — Ambulatory Visit
Admit: 2014-10-07 | Disposition: A | Discharge status: Home / Self Care | Payer: Self-pay | Attending: Family Medicine | Admitting: Family Medicine

## 2014-10-07 DIAGNOSIS — M797 Fibromyalgia: Secondary | ICD-10-CM | POA: Diagnosis not present

## 2014-10-07 DIAGNOSIS — J189 Pneumonia, unspecified organism: Secondary | ICD-10-CM | POA: Diagnosis not present

## 2014-10-07 DIAGNOSIS — K551 Chronic vascular disorders of intestine: Secondary | ICD-10-CM | POA: Diagnosis not present

## 2014-10-07 DIAGNOSIS — K449 Diaphragmatic hernia without obstruction or gangrene: Secondary | ICD-10-CM | POA: Diagnosis not present

## 2014-10-07 DIAGNOSIS — R05 Cough: Secondary | ICD-10-CM | POA: Diagnosis not present

## 2014-10-17 NOTE — Op Note (Signed)
PATIENT NAME:  Colleen Cooper, Colleen Cooper MR#:  [id number] DATE OF BIRTH:  December 21, 1940  DATE OF PROCEDURE:  09/22/2013  PREOPERATIVE DIAGNOSES: 1.  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  2.  Abdominal bruit.  3.  Fibromyalgia.   POSTOPERATIVE DIAGNOSES:   1.  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  2.  Abdominal bruit.  3.  Fibromyalgia.   PROCEDURES PERFORMED: 1.  Ultrasound guidance for vascular access, right femoral artery.  2.  Catheter placement of the SMA from right femoral approach.  3.  Aortogram and selective mesenteric SMA angiogram.  4.  Balloon expandable stent placement to SMA with 6 mm diameter balloon expandable stent.  5.  StarClose closure device, right femoral artery.   SURGEON: Annice NeedyJason S. Leanna Hamid, M.D.   ANESTHESIA: Local with moderate conscious sedation.   ESTIMATED BLOOD LOSS: Approximately 25 mL.   FLUOROSCOPY TIME: Two minutes and 30 mL of contrast were used.  INDICATION FOR PROCEDURE: This is a 74 year old female who was initially referred to us for abdominal bruit. She does describe some abdominal pain and often this is postprandial in nature. Her noninvasive study showed significant SMA stenosis, elevated velocities in the IMA as well and occluded celiac. We discussed angiography for further evaluation and potential treatment. Risks and benefits were discussed. Informed consent was obtained.   DESCRIPTION OF PROCEDURE: The patient is brought to the vascular suite. Groin was shaved and prepped and a sterile surgical field was created. Ultrasound was used to visualize a patent right femoral artery. It was then accessed under direct ultrasound guidance without difficulty with Seldinger needle. A J-wire and 5-French sheath were then placed. Pigtail  catheter was placed in the aorta at the T12 level and AP and lateral projection aortogram was performed. This demonstrated occlusion of the celiac artery, a large meandering mesenteric artery off of the IMA and an SMA with high-grade stenosis that was greater than 80%; it was a little difficult to opacify, but appeared to be in the 85% to 90% range, and a napkin ring-type lesion about 5 to 6 mm beyond the origin. The patient was systemically heparinized. A 6-French sheath was placed. I used a 6-French IM guide catheter to selectively cannulate the SMA. I crossed the lesion without difficulty with a Glidewire then exchanged for a [place] Slam 0.014 wire. Selective imaging performed through the 6-French IM guide catheter showed the stenosis better, and this did appear to be an 85% to 90% stenosis. A 6 mm diameter x 18 mm length Herculink balloon expandable stent was selected and deployed in the superior mesenteric artery encompassing the lesion. A tight waist was taken, which resolved with angioplasty, and completion angiogram following this showed the artery to be widely patent without any significant residual stenosis with a stent in excellent location. At this point, I elected to terminate the procedure. The guide catheter was removed. Oblique arteriogram was performed of the right femoral artery. The  StarClose closure device was deployed in the usual fashion with an excellent hemostatic result. The patient tolerated the procedure well and was taken the recovery room in stable condition.     ____________________________ Annice NeedyJason S. Brandie Lopes, MD jsd:dmm D: 09/22/2013 11:08:59 ET T: 09/22/2013 12:41:05 ET JOB#: [id number]  cc: Annice NeedyJason S. Katori Wirsing, MD, <Dictator> Janeann ForehandJames H. Hawkins Jr., MD Annice NeedyJASON S Evett Kassa MD ELECTRONICALLY SIGNED 09/29/2013 9:50

## 2014-10-18 NOTE — Op Note (Signed)
PATIENT NAME:  Alcide CleverCOOPER, Tynisha L MR#:  [id number] DATE OF BIRTH:  30-Dec-1940  DATE OF PROCEDURE:  07/25/2011  PREOPERATIVE DIAGNOSIS:  Senile cataract, right eye.  POSTOPERATIVE DIAGNOSIS:  Senile cataract, right eye.  PROCEDURE:  Phacoemulsification with posterior chamber intraocular lens implantation of the right eye.  LENS:  SN60WF 20.5-diopter posterior chamber intraocular lens.  ULTRASOUND TIME:  10% of  59 seconds.  CDE 5.8.  SURGEON:  Italyhad Eline Geng, MD  ANESTHESIA:  Topical with tetracaine drops and 2% Xylocaine jelly.  COMPLICATIONS:  None.  DESCRIPTION OF PROCEDURE:  The patient was identified in the holding room and transported to the operating room and placed in the supine position under the operating microscope.  The right eye was identified as the operative eye and it was prepped and draped in the usual sterile ophthalmic fashion.  A 1 millimeter clear-corneal paracentesis was made at the 1:30 position.  The anterior chamber was filled with Viscoat viscoelastic.  A 2.4 millimeter keratome was used to make a near-clear corneal incision at the 10:30 position.  A curvilinear capsulorrhexis was made with a cystotome and capsulorrhexis forceps.  Balanced salt solution was used to hydrodissect and hydrodelineate the nucleus.  Phacoemulsification was then used in horizontal chopping fashion to remove the lens nucleus and epinucleus.  The remaining cortex was then removed using the irrigation and aspiration handpiece. Provisc was then placed into the capsular bag to distend it for lens placement.  An SN60WF 20.5-diopter lens was then injected into the capsular bag.  The remaining viscoelastic was aspirated.  Wounds were hydrated with balanced salt solution.  The anterior chamber was inflated to a physiologic pressure with balanced salt solution.  Miostat was placed into the anterior chamber to constrict the pupil.  No wound leaks were noted.  Topical Vigamox drops and Maxitrol ointment  were applied to the eye.  The patient was taken to the recovery room in stable condition without complications of anesthesia or surgery.  ____________________________ Deirdre Evenerhadwick R. Jaicob Dia, MD crb:cbb D: 07/25/2011 10:17:48 ET T: 07/25/2011 11:21:05 ET JOB#: [id number]  cc: Deirdre Evenerhadwick R. Yana Schorr, MD, <Dictator> Lockie MolaHADWICK Aasha Dina MD ELECTRONICALLY SIGNED 07/26/2011 11:12

## 2014-10-25 NOTE — Op Note (Signed)
PATIENT NAME:  Colleen Cooper, Colleen Cooper MR#:  [id number] DATE OF BIRTH:  01-May-1941  DATE OF PROCEDURE:  09/14/2014  PREOPERATIVE DIAGNOSES:  1.  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  2.  Celiac occlusion.  3.  Fibromyalgia.  4.  Barrett esophagus.   POSTOPERATIVE DIAGNOSES:  1.  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  2.  Celiac occlusion.  3.  Fibromyalgia.  4.  Barrett esophagus.   PROCEDURES: 1.  Ultrasound guidance for vascular access to right femoral artery.  2.  Catheter placement to superior mesenteric artery from right femoral approach.  3.  Aortogram and selective superior mesenteric artery angiogram.  4.  Percutaneous transluminal angioplasty of superior mesenteric artery stent for recurrent stenosis with 6 mm diameter drug-eluting balloon and 7 mm diameter conventional balloon.  5.  StarClose closure device, right femoral artery.   SURGEON: Annice NeedyJason S. Dew, M.D.   ANESTHESIA: Local with moderate conscious sedation.   ESTIMATED BLOOD LOSS: Minimal.   INDICATION FOR PROCEDURE: This is a 74 year old female with chronic visceral ischemia. On her recent duplex, her SMA stent had a very high-grade recurrent stenosis. Her celiac is occluded chronically and this was confirmed with CT angiogram, although imaging of the CT angiogram was quite limited and full evaluation was not really possible given the artifact from the stent. I recommended angiography for evaluation and potential treatment and it appeared as if her IMA was large and she was essentially perfusing her entire bowel off of the IMA. Risks and benefits of angiography and possible intervention were discussed and informed consent was obtained.   DESCRIPTION OF PROCEDURE: The patient is brought to the vascular suite. Groins were shaved and prepped and a sterile surgical field was created. The right femoral artery was localized with  fluoroscopy. The right femoral artery was visualized on ultrasound and found to be widely patent. It was then accessed under direct ultrasound guidance without difficulty with a Seldinger needle. A J-wire and 5-French sheath were placed. A pigtail catheter was placed in the aorta and an AP and then a lateral projection aortogram was performed. This demonstrated what appeared to be largely retrograde filling of the celiac and SMA through the IMA. The celiac was occluded. The SMA had a very high-grade recurrent stenosis of greater than 90% with only minimal forward flow seen through the stent. I then heparinized the patient, upsized to a 6-French Ansell sheath. I was able to selectively cannulate the SMA and cross the stenosis and gain access into the distal SMA beyond the stent confirming intraluminal flow with a C2 catheter. I then replaced the Terumo Advantage wire and was able to advance the sheath into the stent. Again high-grade stenosis was confirmed with imaging. I ballooned this area with a 6 mm diameter Lutonix drug-coated angioplasty balloon with an inflation held for 1 minute and then a 7 mm diameter conventional angioplasty balloon to the SMA stent. Completion angiogram following this showed markedly improved flow with less than 10% residual stenosis identified, and at this point I elected to terminate the procedure. The sheath was removed and StarClose closure device was deployed in the usual fashion with excellent hemostatic result. The patient tolerated the procedure well and was taken to the recovery room in stable condition.   ____________________________ Annice NeedyJason S. Dew, MD jsd:sb D: 09/14/2014 10:46:05 ET T: 09/14/2014 11:25:51 ET JOB#: [id number]  cc: Annice NeedyJason S. Dew, MD, <Dictator> Janeann ForehandJames H. Hawkins Jr., MD Annice NeedyJASON S DEW  MD ELECTRONICALLY SIGNED 09/14/2014 15:03

## 2014-10-28 DIAGNOSIS — M79609 Pain in unspecified limb: Secondary | ICD-10-CM | POA: Diagnosis not present

## 2014-10-28 DIAGNOSIS — I771 Stricture of artery: Secondary | ICD-10-CM | POA: Diagnosis not present

## 2014-11-09 DIAGNOSIS — K551 Chronic vascular disorders of intestine: Secondary | ICD-10-CM | POA: Diagnosis not present

## 2014-11-09 DIAGNOSIS — M797 Fibromyalgia: Secondary | ICD-10-CM | POA: Diagnosis not present

## 2014-11-13 DIAGNOSIS — K209 Esophagitis, unspecified: Secondary | ICD-10-CM | POA: Diagnosis not present

## 2014-11-13 DIAGNOSIS — M797 Fibromyalgia: Secondary | ICD-10-CM | POA: Diagnosis not present

## 2014-11-13 DIAGNOSIS — K2270 Barrett's esophagus without dysplasia: Secondary | ICD-10-CM | POA: Diagnosis not present

## 2014-11-13 DIAGNOSIS — R0789 Other chest pain: Secondary | ICD-10-CM | POA: Diagnosis not present

## 2014-11-13 DIAGNOSIS — F329 Major depressive disorder, single episode, unspecified: Secondary | ICD-10-CM | POA: Diagnosis not present

## 2014-11-17 DIAGNOSIS — M797 Fibromyalgia: Secondary | ICD-10-CM | POA: Insufficient documentation

## 2014-11-17 DIAGNOSIS — L570 Actinic keratosis: Secondary | ICD-10-CM | POA: Diagnosis not present

## 2014-11-17 DIAGNOSIS — L650 Telogen effluvium: Secondary | ICD-10-CM | POA: Diagnosis not present

## 2014-11-30 ENCOUNTER — Other Ambulatory Visit: Payer: Self-pay | Admitting: Family Medicine

## 2014-11-30 ENCOUNTER — Telehealth: Payer: Self-pay

## 2014-11-30 MED ORDER — TRAMADOL HCL 50 MG PO TABS: 50.0000 mg | ORAL_TABLET | Freq: Two times a day (BID) | ORAL | Status: DC | PRN

## 2014-11-30 NOTE — Telephone Encounter (Signed)
Called to alert patient RX is up front for Tramadol.Left message to pick up paperwork she is waiting on and I will try yo reach her if not picked up later today.

## 2014-12-11 ENCOUNTER — Telehealth: Payer: Self-pay | Admitting: Family Medicine

## 2014-12-11 NOTE — Telephone Encounter (Signed)
Pt states she had an appointment last week and states she noticed she was prescribed Tramadol and the prescription was written for less then her normal amount. Pt wanted to know the reason behind the prescription change.

## 2014-12-11 NOTE — Telephone Encounter (Signed)
Please Review and let me know what to tell her.

## 2014-12-14 NOTE — Telephone Encounter (Signed)
She told me she was taking less that she had been.  She got a month worth of what she reported to me.  We can recheck that if needed.-jh

## 2014-12-15 ENCOUNTER — Other Ambulatory Visit: Payer: Self-pay | Admitting: Family Medicine

## 2014-12-15 MED ORDER — TRAMADOL HCL 50 MG PO TABS: ORAL_TABLET | ORAL | Status: AC

## 2014-12-15 NOTE — Telephone Encounter (Signed)
Rx up front and patient notified to pick up on VM.

## 2014-12-15 NOTE — Telephone Encounter (Signed)
I will print off a script for her for her to come and pick up.

## 2014-12-15 NOTE — Telephone Encounter (Signed)
Patient states that she has never went down on Tramadol. She takes 2 daily in morning and does not recall reporting anything other than that. Requesting this be fixed.

## 2014-12-21 ENCOUNTER — Telehealth: Payer: Self-pay | Admitting: Family Medicine

## 2014-12-21 NOTE — Telephone Encounter (Signed)
error 

## 2014-12-25 ENCOUNTER — Ambulatory Visit (INDEPENDENT_AMBULATORY_CARE_PROVIDER_SITE_OTHER): Payer: Medicare Other | Admitting: Cardiovascular Disease

## 2014-12-25 ENCOUNTER — Encounter: Payer: Self-pay | Admitting: Cardiovascular Disease

## 2014-12-25 VITALS — BP 110/70 | HR 65 | Ht 64.0 in | Wt 159.8 lb

## 2014-12-25 DIAGNOSIS — I771 Stricture of artery: Secondary | ICD-10-CM | POA: Diagnosis not present

## 2014-12-25 DIAGNOSIS — R740 Nonspecific elevation of levels of transaminase and lactic acid dehydrogenase [LDH]: Secondary | ICD-10-CM

## 2014-12-25 DIAGNOSIS — R9431 Abnormal electrocardiogram [ECG] [EKG]: Secondary | ICD-10-CM | POA: Diagnosis not present

## 2014-12-25 DIAGNOSIS — R0789 Other chest pain: Secondary | ICD-10-CM

## 2014-12-25 DIAGNOSIS — K551 Chronic vascular disorders of intestine: Secondary | ICD-10-CM | POA: Insufficient documentation

## 2014-12-25 DIAGNOSIS — R7401 Elevation of levels of liver transaminase levels: Secondary | ICD-10-CM

## 2014-12-25 DIAGNOSIS — K219 Gastro-esophageal reflux disease without esophagitis: Secondary | ICD-10-CM | POA: Insufficient documentation

## 2014-12-25 NOTE — Progress Notes (Signed)
Patient ID: Colleen CleverSarah L Cooper, female    DOB: 09/05/1940, 74 y.o.   MRN: [id number]  HPI Comments: Colleen Cooper is a very pleasant 74 year old woman with no prior cardiac history of coronary artery disease, with significant history of GI disease including GERD, hiatal hernia, Barrett's per the patient has been maintained on PPI, pneumonia in January 2016 with elevated LFTs at that time, history of SMA disease requiring stent in March 2015, repeat vascular procedure 09/14/2014 for restenosis who presents to establish care in the [place] for abnormal EKG and chest pain.  She reports that several weeks ago she woke up from sleep with symptoms concerning for indigestion. It lasted longer than prior episodes. She did have some radiating discomfort up to her jaw and left ear lasting less than 1 minute. She reports having a significant GI history as above. Symptoms went away without intervention. Since then she has not had any further episodes.  She is typically very active at baseline. No regular exercise program but she does all of her ADLs, maintains a house, lots of gardening. She denies any chest pain with any heavy activity.   Rarely she has episode of tachycardia lasting for a few seconds at a time. She attributes this to her mitral valve prolapse which was previously diagnosed though she is unaware of the details.  EKG on today's visit shows normal sinus rhythm with rate 65 bpm, no significant ST or T-wave changes   No Known Allergies  Current Outpatient Prescriptions on File Prior to Visit  Medication Sig Dispense Refill  . amitriptyline (ELAVIL) 25 MG tablet Take 1 tablet (25 mg total) by mouth at bedtime. 30 tablet 0  . ibuprofen (ADVIL,MOTRIN) 400 MG tablet Take 400 mg by mouth every 6 (six) hours as needed for fever or headache.    . TDaP (BOOSTRIX) 5-2.5-18.5 LF-MCG/0.5 injection Inject 0.5 mLs into the muscle once. 0.5 mL 0  . traMADol (ULTRAM) 50 MG tablet Take 2 tablets twice  daily as needed for pain. 120 tablet 3   No current facility-administered medications on file prior to visit.    Past Medical History  Diagnosis Date  . Barrett's esophagus     resolved by last endoscopy Dec 2011  . Fibromyalgia syndrome   . Hyperlipidemia   . Hypertension     Past Surgical History  Procedure Laterality Date  . Shoulder arthroscopy w/ labral repair  2003    right shoulder   . Abdominal hysterectomy    . Oophorectomy  1970  . Knee arthroscopy  2006    right knee  . Shoulder arthroscopy      remote  . Tonsillectomy      Social History  reports that she quit smoking about 36 years ago. She has never used smokeless tobacco. She reports that she drinks alcohol. She reports that she does not use illicit drugs.  Family History family history includes Asthma in her mother.   Review of Systems  Constitutional: Negative.   Respiratory: Negative.   Cardiovascular: Positive for chest pain.  Gastrointestinal: Negative.   Musculoskeletal: Negative.   Skin: Negative.   Neurological: Negative.   Hematological: Negative.   Psychiatric/Behavioral: Negative.   All other systems reviewed and are negative.   BP 110/70 mmHg  Pulse 65  Ht 5\' 4"  (1.626 m)  Wt 159 lb 12 oz (72.462 kg)  BMI 27.41 kg/m2  Physical Exam  Constitutional: She is oriented to person, place, and time. She appears well-developed and well-nourished.  HENT:  Head: Normocephalic.  Nose: Nose normal.  Mouth/Throat: Oropharynx is clear and moist.  Eyes: Conjunctivae are normal. Pupils are equal, round, and reactive to light.  Neck: Normal range of motion. Neck supple. No JVD present.  Cardiovascular: Normal rate, regular rhythm, normal heart sounds and intact distal pulses.  Exam reveals no gallop and no friction rub.   No murmur heard. Pulmonary/Chest: Effort normal and breath sounds normal. No respiratory distress. She has no wheezes. She has no rales. She exhibits no tenderness.   Abdominal: Soft. Bowel sounds are normal. She exhibits no distension. There is no tenderness.  Musculoskeletal: Normal range of motion. She exhibits no edema or tenderness.  Lymphadenopathy:    She has no cervical adenopathy.  Neurological: She is alert and oriented to person, place, and time. Coordination normal.  Skin: Skin is warm and dry. No rash noted. No erythema.  Psychiatric: She has a normal mood and affect. Her behavior is normal. Judgment and thought content normal.

## 2014-12-25 NOTE — Assessment & Plan Note (Signed)
She reports significant GI pathology. She is currently on PPI. Recent episode 3 weeks ago concerning for hiatal hernia with radiating nerve pain to her neck and jaw. Recommended she continue on her PPI, take tums, H2 blockers as needed. If symptoms get worse recommend she call our office or Dr. Juanetta GoslingHawkins

## 2014-12-25 NOTE — Assessment & Plan Note (Signed)
Recent episode of chest discomfort several weeks ago most consistent with indigestion. CT scan of the chest reviewed showing no significant coronary artery disease, coronary calcification, PAD. Only has stent which is visible in the SMA, proximal region EKG is normal. As she has typical reflux symptoms, less likely angina, no further workup at this time. We have discussed what to look for if she has additional symptoms. If she has symptoms at rest, she will try Tums, Pepcid, simethicone If symptoms presents on a more frequent basis, lasting longer, particularly with exertion, would order a Myoview. She will call us if she has the symptoms

## 2014-12-25 NOTE — Assessment & Plan Note (Signed)
Elevated LFTs in January 2016 in the setting of pneumonia She reports repeat labs through Dr. Juanetta GoslingHawkins have come back normal

## 2014-12-25 NOTE — Assessment & Plan Note (Signed)
She is followed by Dr. Wyn Quakerew. Prior stent in 2015, repeat intervention March 2016 Review of her CT shows no other significant PAD

## 2014-12-25 NOTE — Patient Instructions (Signed)
You are doing well. No medication changes were made.  If you have more chest pain, Take tums, 2 pepcid/zantac, Try soda to belch  (or GAS-x)  Please call us if you have new issues that need to be addressed before your next appt.

## 2014-12-31 ENCOUNTER — Telehealth: Payer: Self-pay | Admitting: Family Medicine

## 2014-12-31 DIAGNOSIS — K551 Chronic vascular disorders of intestine: Secondary | ICD-10-CM

## 2014-12-31 DIAGNOSIS — I771 Stricture of artery: Principal | ICD-10-CM

## 2014-12-31 MED ORDER — CLOPIDOGREL BISULFATE 75 MG PO TABS: 75.0000 mg | ORAL_TABLET | Freq: Every day | ORAL | Status: AC

## 2014-12-31 NOTE — Telephone Encounter (Signed)
Pt said she was out of plavix but didn't know if Dr. Juanetta GoslingHawkins even wanted to refill it because the cardiologist didn't see any plaque on the mri.  He said itt was mainly scar tissue.  She is in [place] so please call her cell number is you have any questions.

## 2014-12-31 NOTE — Telephone Encounter (Signed)
This needs to be reviewed by Dr. Juanetta GoslingHawkins upon his return. Looking at her last visit to Dr. Mariah MillingGollan, he never noted she was on plavix. She might be on it for vascular issue. To be on the safe side, I have refilled it for 1 month and Dr. Juanetta GoslingHawkins can determine if it needs to be continued or defer to vascular's judgement on his return.

## 2014-12-31 NOTE — Telephone Encounter (Signed)
Should this be filled or wait on Hawkins?

## 2015-01-04 NOTE — Telephone Encounter (Signed)
She is on Plavix from Vascular.  She should continue it.  She should have a PCP in [place]. Soon to follow this for her.  Be sure she was informed that this had been sent in and that she is supposed to continue taking it once daily until told by a vascular specialist to stop.-jh

## 2015-01-04 NOTE — Telephone Encounter (Signed)
Left message

## 2015-01-04 NOTE — Telephone Encounter (Signed)
Advised to get [place]CP in [place]. Advised Plavix is controlled by [facility]. Jh

## 2015-01-14 ENCOUNTER — Encounter: Payer: Self-pay | Admitting: Urology

## 2015-01-14 ENCOUNTER — Ambulatory Visit (INDEPENDENT_AMBULATORY_CARE_PROVIDER_SITE_OTHER): Payer: Medicare Other | Admitting: Urology

## 2015-01-14 VITALS — Ht 64.0 in | Wt 158.2 lb

## 2015-01-14 DIAGNOSIS — R3916 Straining to void: Secondary | ICD-10-CM

## 2015-01-14 LAB — MICROSCOPIC EXAMINATION: Bacteria, UA: NONE SEEN

## 2015-01-14 LAB — BLADDER SCAN AMB NON-IMAGING

## 2015-01-14 LAB — URINALYSIS, COMPLETE
BILIRUBIN UA: NEGATIVE
Glucose, UA: NEGATIVE
LEUKOCYTES UA: NEGATIVE
NITRITE UA: NEGATIVE
PH UA: 5 (ref 5.0–7.5)
PROTEIN UA: NEGATIVE
Specific Gravity, UA: >1.03 — ABNORMAL HIGH (ref 1.005–1.030)
Urobilinogen, Ur: 0.2 mg/dL (ref 0.2–1.0)

## 2015-01-14 NOTE — Progress Notes (Signed)
01/14/2015 12:26 PM   Colleen Cooper 1940/09/05 [id number]  Referring provider: Janeann Forehand., MD [address] [place], [place] [address]  Chief Complaint  Patient presents with  . Urinary Retention    HPI: 74 yo F who returns today for  reevaluation of urinary symptoms.  She reports today that she is not voiding as frequently as she should sometimes when she does void, she has difficulty emptying her bladder.  She is not drinking as much as she should.  She can tell that she does need to go without urgency or frequency.    She does feel that she is able to empty her bladder completely but does have some post void dribbling.  She does not get up at night to void.    No recent UTIs or gross hematuria since admission in January 2016.    Denies incontinence.    She is also very concerned about taking her Protonix as she heard it can affect her kidney function.  She is moving to [place] and wants to check in with all of her doctors before she leaves town.      PMH: Past Medical History  Diagnosis Date  . Barrett's esophagus     resolved by last endoscopy Dec 2011  . Fibromyalgia syndrome   . Hyperlipidemia   . Hypertension     Surgical History: Past Surgical History  Procedure Laterality Date  . Shoulder arthroscopy w/ labral repair  2003    right shoulder   . Abdominal hysterectomy    . Oophorectomy  1970  . Knee arthroscopy  2006    right knee  . Shoulder arthroscopy      remote  . Tonsillectomy      Home Medications:    Medication List       This list is accurate as of: 01/14/15 11:59 PM.  Always use your most recent med list.               clopidogrel 75 MG tablet  Commonly known as:  PLAVIX  Take 1 tablet (75 mg total) by mouth daily.     pantoprazole 40 MG tablet  Commonly known as:  PROTONIX  Take 40 mg by mouth daily.     traMADol 50 MG tablet  Commonly known as:  ULTRAM  Take 2 tablets twice daily as needed for pain.         Allergies: No Known Allergies  Family History: Family History  Problem Relation Age of Onset  . Asthma Mother   . Bladder Cancer Neg Hx   . Kidney cancer Neg Hx   . Prostate cancer Neg Hx     Social History:  reports that she quit smoking about 36 years ago. She has never used smokeless tobacco. She reports that she drinks alcohol. She reports that she does not use illicit drugs.  ROS: UROLOGY Frequent Urination?: No Hard to postpone urination?: No Burning/pain with urination?: No Get up at night to urinate?: No Leakage of urine?: Yes Urine stream starts and stops?: No Trouble starting stream?: Yes Do you have to strain to urinate?: Yes Blood in urine?: No Urinary tract infection?: No Sexually transmitted disease?: No Injury to kidneys or bladder?: No Painful intercourse?: No Weak stream?: No Currently pregnant?: No Vaginal bleeding?: No Last menstrual period?: hysterectomy  Gastrointestinal Nausea?: No Vomiting?: No Indigestion/heartburn?: Yes Diarrhea?: No Constipation?: Yes  Constitutional Fever: No Night sweats?: Yes Weight loss?: No Fatigue?: Yes  Skin Skin rash/lesions?:  No Itching?: No  Eyes Blurred vision?: No Double vision?: No  Ears/Nose/Throat Sore throat?: No Sinus problems?: Yes  Hematologic/Lymphatic Swollen glands?: No Easy bruising?: Yes  Cardiovascular Leg swelling?: No Chest pain?: No  Respiratory Cough?: No Shortness of breath?: No  Endocrine Excessive thirst?: No  Musculoskeletal Back pain?: No Joint pain?: Yes  Neurological Headaches?: No Dizziness?: No  Psychologic Depression?: No Anxiety?: Yes  Physical Exam: Ht  (1.626 m)  Wt 158 lb 3.2 oz (71.759 kg)  BMI 27.14 kg/m2  Constitutional:  Alert and oriented, No acute distress. HEENT: [place] AT, moist mucus membranes.  Trachea midline, no masses. Cardiovascular: No clubbing, cyanosis, or edema. Respiratory: Normal respiratory effort, no increased  work of breathing. GI: Abdomen is soft, nontender, nondistended, no abdominal masses GU: No CVA tenderness.  Skin: No rashes, bruises or suspicious lesions. Neurologic: Grossly intact, no focal deficits, moving all 4 extremities. Psychiatric: Normal mood and affect.  Laboratory Data: Lab Results  Component Value Date   WBC 2.9* 07/04/2014   HGB 10.0* 07/04/2014   HCT 29.5* 07/04/2014   MCV 87.5 07/04/2014   PLT 204 07/04/2014    Lab Results  Component Value Date   CREATININE 0.67 07/06/2014    Urinalysis Results for orders placed or performed in visit on 01/14/15  Microscopic Examination  Result Value Ref Range   WBC, UA 0-5 0 -  5 /hpf   RBC, UA 0-2 0 -  2 /hpf   Epithelial Cells (non renal) 0-10 0 - 10 /hpf   Crystals Present (A) N/A   Crystal Type Calcium Oxalate N/A   Bacteria, UA None seen None seen/Few  Urinalysis, Complete  Result Value Ref Range   Specific Gravity, UA >1.030 (H) 1.005 - 1.030   pH, UA 5.0 5.0 - 7.5   Color, UA Yellow Yellow   Appearance Ur Clear Clear   Leukocytes, UA Negative Negative   Protein, UA Negative Negative/Trace   Glucose, UA Negative Negative   Ketones, UA Trace (A) Negative   RBC, UA Trace (A) Negative   Bilirubin, UA Negative Negative   Urobilinogen, Ur 0.2 0.2 - 1.0 mg/dL   Nitrite, UA Negative Negative   Microscopic Examination See below:   BLADDER SCAN AMB NON-IMAGING  Result Value Ref Range   Scan Result 0ml      Assessment & Plan:  74 year old female with concerns of incomplete bladder emptying, however, post void residual today is minimal. She was reassured today that she does void well and had no other significant voiding symptoms were identified.  Normal kidney function. No GU pathology identified.  1. Strains to urinate - Urinalysis, Complete - BLADDER SCAN AMB NON-IMAGING   [name], MD  [facility] [address] [place], [place] [address] [phone]

## 2015-01-16 DIAGNOSIS — Z79899 Other long term (current) drug therapy: Secondary | ICD-10-CM | POA: Diagnosis not present

## 2015-01-16 DIAGNOSIS — R509 Fever, unspecified: Secondary | ICD-10-CM | POA: Diagnosis not present

## 2015-01-16 DIAGNOSIS — K760 Fatty (change of) liver, not elsewhere classified: Secondary | ICD-10-CM | POA: Diagnosis not present

## 2015-01-16 DIAGNOSIS — Z8701 Personal history of pneumonia (recurrent): Secondary | ICD-10-CM | POA: Diagnosis not present

## 2015-01-16 DIAGNOSIS — R531 Weakness: Secondary | ICD-10-CM | POA: Diagnosis not present

## 2015-01-19 ENCOUNTER — Ambulatory Visit (INDEPENDENT_AMBULATORY_CARE_PROVIDER_SITE_OTHER): Payer: Medicare Other | Admitting: Family Medicine

## 2015-01-19 ENCOUNTER — Encounter: Payer: Self-pay | Admitting: Family Medicine

## 2015-01-19 VITALS — BP 118/78 | HR 69 | Temp 98.2°F | Resp 16 | Ht 64.0 in | Wt 154.1 lb

## 2015-01-19 DIAGNOSIS — K449 Diaphragmatic hernia without obstruction or gangrene: Secondary | ICD-10-CM | POA: Insufficient documentation

## 2015-01-19 DIAGNOSIS — T7840XA Allergy, unspecified, initial encounter: Secondary | ICD-10-CM | POA: Insufficient documentation

## 2015-01-19 DIAGNOSIS — K529 Noninfective gastroenteritis and colitis, unspecified: Secondary | ICD-10-CM | POA: Diagnosis not present

## 2015-01-19 DIAGNOSIS — R0989 Other specified symptoms and signs involving the circulatory and respiratory systems: Secondary | ICD-10-CM | POA: Insufficient documentation

## 2015-01-19 DIAGNOSIS — M25559 Pain in unspecified hip: Secondary | ICD-10-CM | POA: Insufficient documentation

## 2015-01-19 DIAGNOSIS — R110 Nausea: Secondary | ICD-10-CM

## 2015-01-19 DIAGNOSIS — IMO0001 Reserved for inherently not codable concepts without codable children: Secondary | ICD-10-CM | POA: Insufficient documentation

## 2015-01-19 DIAGNOSIS — R030 Elevated blood-pressure reading, without diagnosis of hypertension: Secondary | ICD-10-CM

## 2015-01-19 DIAGNOSIS — S4990XA Unspecified injury of shoulder and upper arm, unspecified arm, initial encounter: Secondary | ICD-10-CM | POA: Insufficient documentation

## 2015-01-19 DIAGNOSIS — E538 Deficiency of other specified B group vitamins: Secondary | ICD-10-CM | POA: Insufficient documentation

## 2015-01-19 DIAGNOSIS — R109 Unspecified abdominal pain: Secondary | ICD-10-CM | POA: Insufficient documentation

## 2015-01-19 MED ORDER — ONDANSETRON HCL 4 MG PO TABS: 4.0000 mg | ORAL_TABLET | Freq: Three times a day (TID) | ORAL | Status: AC | PRN

## 2015-01-19 MED ORDER — BISMUTH SUBSALICYLATE 262 MG PO CHEW: 524.0000 mg | CHEWABLE_TABLET | ORAL | Status: AC | PRN

## 2015-01-19 NOTE — Patient Instructions (Signed)
I think you have a stomach virus.  We will treat your nausea today. You can take zofran every 8 hours as needed for nausea. This medication can make you sleepy.  We will treat your symptoms with supportive care: Ensure you are drinking plenty of fluids- I recommend diluting gatorade 2oz to 6 oz water or drinking chicken broth for electroyltes and to boost your blood sugar. Start with bland meals when you feel up to it. Drinking is the most important- you do not need to eat if you don't feel well. Try peptobismal to help with your symptoms.  Alarm signs: If you noticed blood in your diarrhea, diarrhea continues for > 1.5-2 weeks without improving, shortness of breath, chest pain, or any concerning symptoms, please seek medical attention.  

## 2015-01-19 NOTE — Progress Notes (Signed)
Subjective:    Patient ID: Colleen Cooper, female    DOB: [birth year], 74 y.o.   MRN: [id number]  HPI: Colleen Cooper is a 74 y.o. female presenting on 01/19/2015 for Nausea   Diarrhea  This is a new problem. The current episode started in the past 7 days. The problem occurs 2 to 4 times per day. The stool consistency is described as watery. Associated symptoms include abdominal pain (heartburn), bloating and chills. Pertinent negatives include no fever, headaches or vomiting. Risk factors include suspect food intake. She has tried increased fluids for the symptoms.    Pt presents for evaluation of nausea, vomiting, and diarrhea. She had food poisoning from [place] food and was seen in the ER in [place]. Food poisoning/ stomach virus started on Wednesday (7/20). Pt has had 3 episodes of diarrhea per day. No vomiting but nausea.  Lab studies and CT scan were done and were normal. She was given fluids for dehydrated.  She has continued to have nausea and diarrhea since being seen. Feeling better today. Still having diarrhea. Nausea is improved.   Past Medical History  Diagnosis Date  . Barrett's esophagus     resolved by last endoscopy Dec 2011  . Fibromyalgia syndrome   . Hyperlipidemia   . Hypertension     Current Outpatient Prescriptions on File Prior to Visit  Medication Sig  . clopidogrel (PLAVIX) 75 MG tablet Take 1 tablet (75 mg total) by mouth daily.  . pantoprazole (PROTONIX) 40 MG tablet Take 40 mg by mouth daily.   . traMADol (ULTRAM) 50 MG tablet Take 2 tablets twice daily as needed for pain.   No current facility-administered medications on file prior to visit.    Review of Systems  Constitutional: Positive for chills. Negative for fever.  HENT: Negative.   Respiratory: Negative for chest tightness and shortness of breath.   Cardiovascular: Negative for chest pain, palpitations and leg swelling.  Gastrointestinal: Positive for abdominal pain (heartburn), diarrhea and  bloating. Negative for vomiting, constipation, blood in stool and rectal pain.  Endocrine: Negative.   Genitourinary: Negative for dysuria and difficulty urinating.  Musculoskeletal: Negative.   Neurological: Negative for dizziness, syncope, light-headedness and headaches.  Psychiatric/Behavioral: The patient is nervous/anxious.    Per HPI unless specifically indicated above     Objective:    BP 118/78 mmHg  Pulse 69  Temp(Src) 98.2 F (36.8 C) (Oral)  Resp 16  Ht 5\' 4"  (1.626 m)  Wt 154 lb 1 oz (69.881 kg)  BMI 26.43 kg/m2  SpO2 99%  Wt Readings from Last 3 Encounters:  01/19/15 154 lb 1 oz (69.881 kg)  01/14/15 158 lb 3.2 oz (71.759 kg)  12/25/14 159 lb 12 oz (72.462 kg)    Physical Exam  Constitutional: She appears well-developed and well-nourished. No distress.  Neck: Normal range of motion. Neck supple.  Cardiovascular: Normal rate and regular rhythm.  Exam reveals no gallop and no friction rub.   No murmur heard. Abdominal: Normal appearance. She exhibits no shifting dullness and no abdominal bruit. Bowel sounds are increased. There is no hepatosplenomegaly. There is no tenderness. There is no rigidity and no guarding.  Lymphadenopathy:    She has no cervical adenopathy.  Skin: Skin is warm and dry. She is not diaphoretic. No pallor.   Results for orders placed or performed in visit on 01/14/15  Microscopic Examination  Result Value Ref Range   WBC, UA 0-5 0 -  5 /hpf   RBC,  UA 0-2 0 -  2 /hpf   Epithelial Cells (non renal) 0-10 0 - 10 /hpf   Crystals Present (A) N/A   Crystal Type Calcium Oxalate N/A   Bacteria, UA None seen None seen/Few  Urinalysis, Complete  Result Value Ref Range   Specific Gravity, UA >1.030 (H) 1.005 - 1.030   pH, UA 5.0 5.0 - 7.5   Color, UA Yellow Yellow   Appearance Ur Clear Clear   Leukocytes, UA Negative Negative   Protein, UA Negative Negative/Trace   Glucose, UA Negative Negative   Ketones, UA Trace (A) Negative   RBC, UA  Trace (A) Negative   Bilirubin, UA Negative Negative   Urobilinogen, Ur 0.2 0.2 - 1.0 mg/dL   Nitrite, UA Negative Negative   Microscopic Examination See below:   BLADDER SCAN AMB NON-IMAGING  Result Value Ref Range   Scan Result 0ml       Assessment & Plan:   Problem List Items Addressed This Visit    None    Visit Diagnoses    Gastroenteritis    -  Primary    Supportive care. Maintain hydration. Peptobismal as needed. Return precautions reviewed. Alarm symptoms reviewed.     Relevant Medications    bismuth subsalicylate (PEPTO-BISMOL) 262 MG chewable tablet    ondansetron (ZOFRAN) 4 MG tablet    Nausea        Treat with PRN zofran.     Relevant Medications    bismuth subsalicylate (PEPTO-BISMOL) 262 MG chewable tablet    ondansetron (ZOFRAN) 4 MG tablet       Meds ordered this encounter  Medications  . DISCONTD: amitriptyline (ELAVIL) 25 MG tablet    Sig: Take by mouth.  . B COMPLEX VITAMINS ER PO    Sig: Take by mouth.  . Calcium Carb-Cholecalciferol (CALCIUM PLUS VITAMIN D3) 600-500 MG-UNIT CAPS    Sig: Take by mouth.  Mar[employer] DIETARY MANAGEMENT PRODUCT PO    Sig: Take by mouth.  . OMEGA-3 FATTY ACIDS PO    Sig: Take by mouth.  . Probiotic CAPS    Sig: Take by mouth.  . DISCONTD: clopidogrel (PLAVIX) 75 MG tablet    Sig: Take by mouth.  . DISCONTD: pantoprazole (PROTONIX) 40 MG tablet    Sig: Take by mouth.  . DISCONTD: traMADol (ULTRAM) 50 MG tablet    Sig: Take by mouth.  . bismuth subsalicylate (PEPTO-BISMOL) 262 MG chewable tablet    Sig: Chew 2 tablets (524 mg total) by mouth as needed.    Dispense:  30 tablet    Refill:  0    Order Specific Question:  Supervising Provider    Answer:  Janeann Forehand [phone]  . ondansetron (ZOFRAN) 4 MG tablet    Sig: Take 1 tablet (4 mg total) by mouth every 8 (eight) hours as needed for nausea or vomiting.    Dispense:  20 tablet    Refill:  0    Order Specific Question:  Supervising Provider    Answer:  Janeann Forehand [[id number]]      Follow up plan: Return if symptoms worsen or fail to improve.

## 2015-01-29 ENCOUNTER — Ambulatory Visit (INDEPENDENT_AMBULATORY_CARE_PROVIDER_SITE_OTHER): Payer: Medicare Other | Admitting: Family Medicine

## 2015-01-29 ENCOUNTER — Encounter: Payer: Self-pay | Admitting: Family Medicine

## 2015-01-29 VITALS — HR 69 | Resp 16 | Ht 64.0 in | Wt 154.0 lb

## 2015-01-29 DIAGNOSIS — I10 Essential (primary) hypertension: Secondary | ICD-10-CM | POA: Insufficient documentation

## 2015-01-29 DIAGNOSIS — M26629 Arthralgia of temporomandibular joint, unspecified side: Secondary | ICD-10-CM

## 2015-01-29 DIAGNOSIS — K5900 Constipation, unspecified: Secondary | ICD-10-CM | POA: Insufficient documentation

## 2015-01-29 DIAGNOSIS — J189 Pneumonia, unspecified organism: Secondary | ICD-10-CM | POA: Insufficient documentation

## 2015-01-29 DIAGNOSIS — L409 Psoriasis, unspecified: Secondary | ICD-10-CM | POA: Insufficient documentation

## 2015-01-29 DIAGNOSIS — M792 Neuralgia and neuritis, unspecified: Secondary | ICD-10-CM | POA: Insufficient documentation

## 2015-01-29 DIAGNOSIS — J181 Lobar pneumonia, unspecified organism: Secondary | ICD-10-CM | POA: Insufficient documentation

## 2015-01-29 DIAGNOSIS — M79643 Pain in unspecified hand: Secondary | ICD-10-CM | POA: Insufficient documentation

## 2015-01-29 DIAGNOSIS — R05 Cough: Secondary | ICD-10-CM | POA: Insufficient documentation

## 2015-01-29 DIAGNOSIS — G5690 Unspecified mononeuropathy of unspecified upper limb: Secondary | ICD-10-CM | POA: Insufficient documentation

## 2015-01-29 DIAGNOSIS — R600 Localized edema: Secondary | ICD-10-CM | POA: Insufficient documentation

## 2015-01-29 DIAGNOSIS — N951 Menopausal and female climacteric states: Secondary | ICD-10-CM | POA: Insufficient documentation

## 2015-01-29 DIAGNOSIS — M2662 Arthralgia of temporomandibular joint: Secondary | ICD-10-CM | POA: Diagnosis not present

## 2015-01-29 DIAGNOSIS — J45909 Unspecified asthma, uncomplicated: Secondary | ICD-10-CM | POA: Insufficient documentation

## 2015-01-29 DIAGNOSIS — Z1389 Encounter for screening for other disorder: Secondary | ICD-10-CM | POA: Insufficient documentation

## 2015-01-29 DIAGNOSIS — Z8719 Personal history of other diseases of the digestive system: Secondary | ICD-10-CM | POA: Insufficient documentation

## 2015-01-29 DIAGNOSIS — L509 Urticaria, unspecified: Secondary | ICD-10-CM | POA: Insufficient documentation

## 2015-01-29 DIAGNOSIS — M79676 Pain in unspecified toe(s): Secondary | ICD-10-CM | POA: Insufficient documentation

## 2015-01-29 DIAGNOSIS — I7090 Unspecified atherosclerosis: Secondary | ICD-10-CM | POA: Insufficient documentation

## 2015-01-29 DIAGNOSIS — R059 Cough, unspecified: Secondary | ICD-10-CM | POA: Insufficient documentation

## 2015-01-29 DIAGNOSIS — R252 Cramp and spasm: Secondary | ICD-10-CM | POA: Insufficient documentation

## 2015-01-29 NOTE — Progress Notes (Signed)
Name: Colleen Cooper   MRN: [id number]    DOB: 1941-05-11   Date:01/29/2015       Progress Note  Subjective  Chief Complaint  Chief Complaint  Patient presents with  . Otitis Media    Right ear may be swelling    HPI  C/o R. Ear pain and swelling and decreased hearing.  Also some nasal congestion.  No problem-specific assessment & plan notes found for this encounter.   Past Medical History  Diagnosis Date  . Barrett's esophagus     resolved by last endoscopy Dec 2011  . Fibromyalgia syndrome   . Hyperlipidemia   . Hypertension     History  Substance Use Topics  . Smoking status: Former Smoker    Quit date: 12/07/1978  . Smokeless tobacco: Never Used  . Alcohol Use: 0.0 oz/week    0 Standard drinks or equivalent per week     Comment: ocassional     Current outpatient prescriptions:  .  B COMPLEX VITAMINS ER PO, Take by mouth., Disp: , Rfl:  .  bismuth subsalicylate (PEPTO-BISMOL) 262 MG chewable tablet, Chew 2 tablets (524 mg total) by mouth as needed., Disp: 30 tablet, Rfl: 0 .  Calcium Carb-Cholecalciferol (CALCIUM PLUS VITAMIN D3) 600-500 MG-UNIT CAPS, Take by mouth., Disp: , Rfl:  .  clopidogrel (PLAVIX) 75 MG tablet, Take 1 tablet (75 mg total) by mouth daily., Disp: 30 tablet, Rfl: 0 .  DIETARY MANAGEMENT PRODUCT PO, Take by mouth., Disp: , Rfl:  .  OMEGA-3 FATTY ACIDS PO, Take by mouth., Disp: , Rfl:  .  ondansetron (ZOFRAN) 4 MG tablet, Take 1 tablet (4 mg total) by mouth every 8 (eight) hours as needed for nausea or vomiting., Disp: 20 tablet, Rfl: 0 .  pantoprazole (PROTONIX) 40 MG tablet, Take 40 mg by mouth daily. , Disp: , Rfl:  .  Probiotic CAPS, Take by mouth., Disp: , Rfl:  .  traMADol (ULTRAM) 50 MG tablet, Take 2 tablets twice daily as needed for pain., Disp: 120 tablet, Rfl: 3  Allergies  Allergen Reactions  . Meat Extract Anaphylaxis    Review of Systems  Constitutional: Positive for malaise/fatigue. Negative for fever and chills.  HENT:  Positive for congestion, ear pain, hearing loss and tinnitus. Negative for nosebleeds and sore throat.   Eyes: Negative for blurred vision and double vision.  Respiratory: Negative for cough, sputum production, shortness of breath and wheezing.   Cardiovascular: Negative for chest pain and leg swelling.  Skin: Negative for rash.  Neurological: Positive for weakness.      Objective  Filed Vitals:   01/29/15 1622  Pulse: 69  Resp: 16  Height: 5\' 4"  (1.626 m)  Weight: 154 lb (69.854 kg)     Physical Exam  Constitutional: She is well-developed, well-nourished, and in no distress. No distress.  HENT:  Head: Normocephalic and atraumatic.  Right Ear: Hearing, tympanic membrane, external ear and ear canal normal.  Left Ear: Hearing, tympanic membrane, external ear and ear canal normal.  Nose: Nose normal.  Mouth/Throat: Uvula is midline, oropharynx is clear and moist and mucous membranes are normal.   Very tender R. TMJ to palp., esp. With jaw movement.  Vitals reviewed.     Recent Results (from the past 2160 hour(s))  Urinalysis, Complete     Status: Abnormal   Collection Time: 01/14/15  9:50 AM  Result Value Ref Range   Specific Gravity, UA >1.030 (H) 1.005 - 1.030   pH, UA 5.0  5.0 - 7.5   Color, UA Yellow Yellow   Appearance Ur Clear Clear   Leukocytes, UA Negative Negative   Protein, UA Negative Negative/Trace   Glucose, UA Negative Negative   Ketones, UA Trace (A) Negative   RBC, UA Trace (A) Negative   Bilirubin, UA Negative Negative   Urobilinogen, Ur 0.2 0.2 - 1.0 mg/dL   Nitrite, UA Negative Negative   Microscopic Examination See below:   Microscopic Examination     Status: Abnormal   Collection Time: 01/14/15  9:50 AM  Result Value Ref Range   WBC, UA 0-5 0 -  5 /hpf   RBC, UA 0-2 0 -  2 /hpf   Epithelial Cells (non renal) 0-10 0 - 10 /hpf   Crystals Present (A) N/A   Crystal Type Calcium Oxalate N/A   Bacteria, UA None seen None seen/Few  BLADDER SCAN  AMB NON-IMAGING     Status: None   Collection Time: 01/14/15  9:54 AM  Result Value Ref Range   Scan Result 0ml      Assessment & Plan  1. TMJ arthralgia

## 2015-01-29 NOTE — Patient Instructions (Signed)
Warm compresses to R. TMJ.  Wear football mouthguard to bed at night.  Eat soft/liquid foods.

## 2015-04-17 ENCOUNTER — Other Ambulatory Visit: Payer: Self-pay | Admitting: Family Medicine

## 2015-10-13 IMAGING — CT CT CHEST W/ CM
2 of 4 series · 15 of 36 positions shown, 18 images · IV contrast (omnipaque)
Comparison: Chest radiograph 07/04/2014

CLINICAL DATA: Pneumonia, fever, cough.

EXAM:
CT CHEST WITH CONTRAST
TECHNIQUE: Multidetector CT imaging of the chest was performed during
intravenous contrast administration.
CONTRAST:  80mL OMNIPAQUE IOHEXOL 300 MG/ML  SOLN

[Series 2: thorax 5.0 i31f 1 · axial · 0.64mm/px · z∈[-142,+143]mm · 12 of 67 slices shown, 15 images]
[im 5/67  mediastinal]
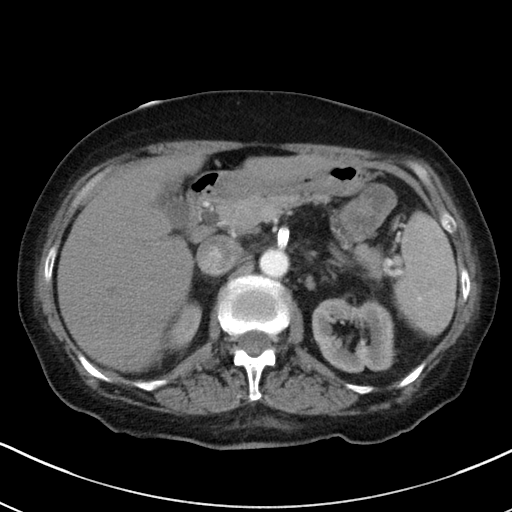
[im 5/67  lung]
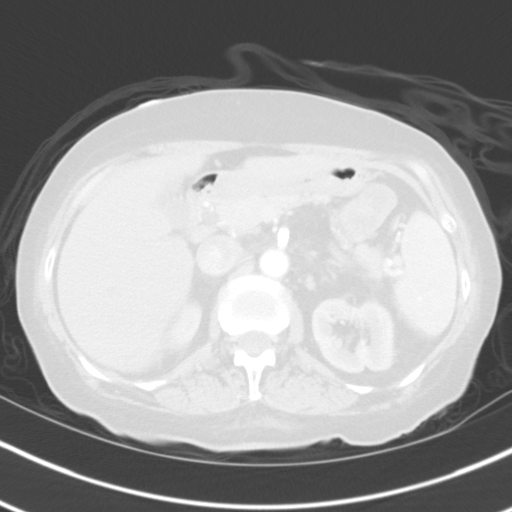
[im 10/67  lung]
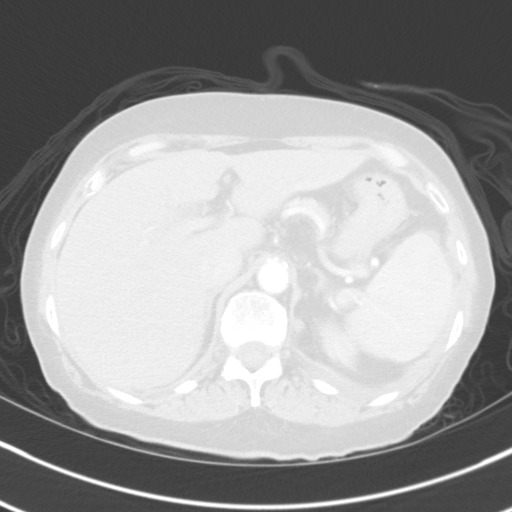
[im 15/67  lung]
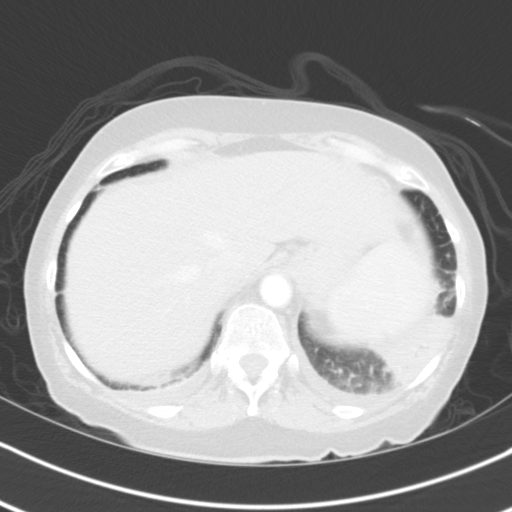
[im 19/67  lung]
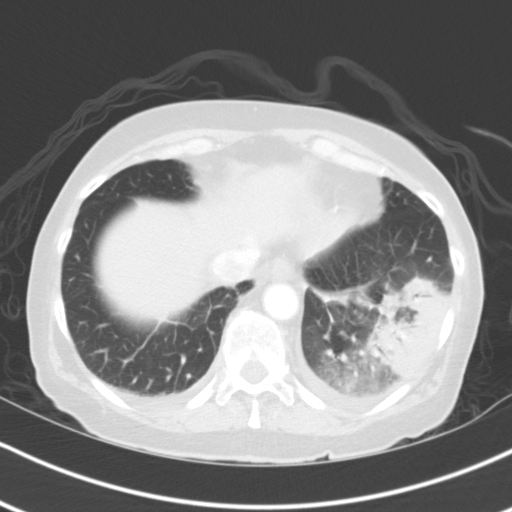
[im 24/67  mediastinal]
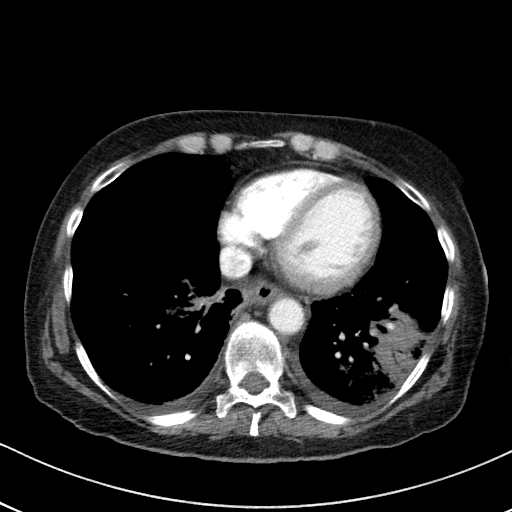
[im 24/67  lung]
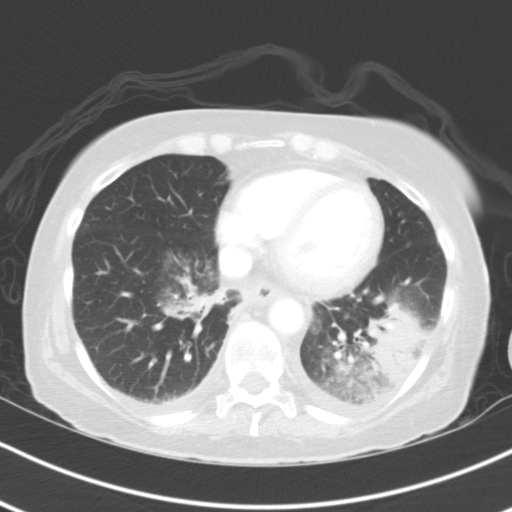
[im 29/67  lung]
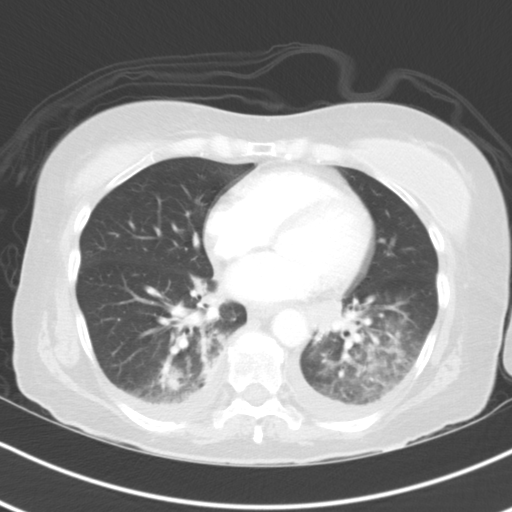
[im 38/67  lung]
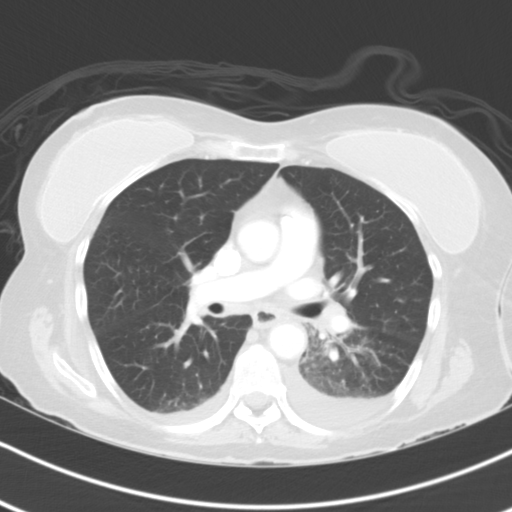
[im 43/67  lung]
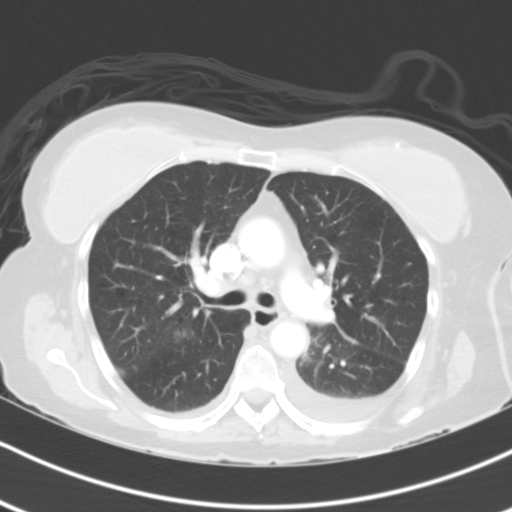
[im 48/67  mediastinal]
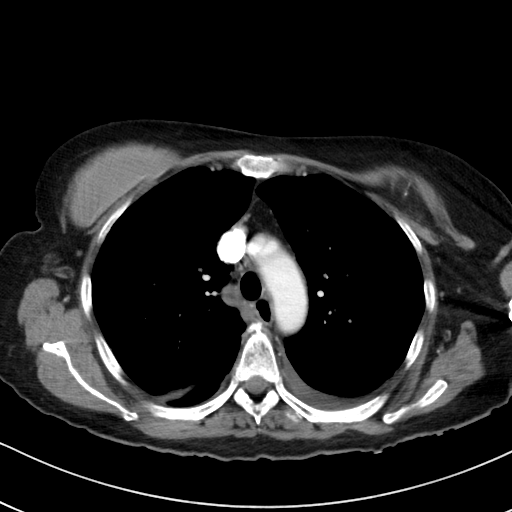
[im 48/67  lung]
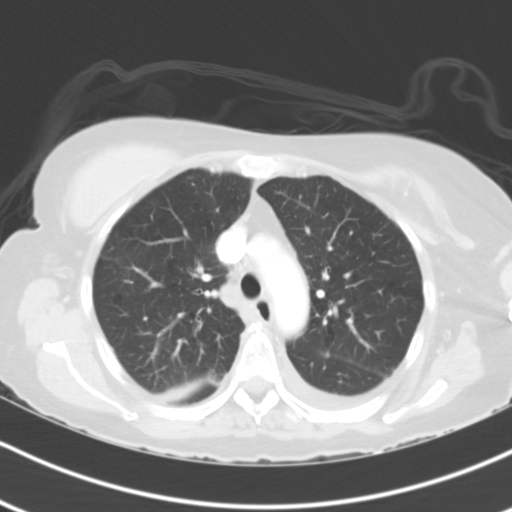
[im 52/67  lung]
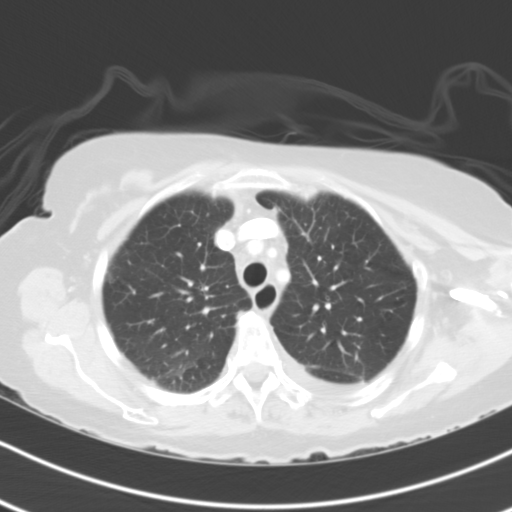
[im 57/67  lung]
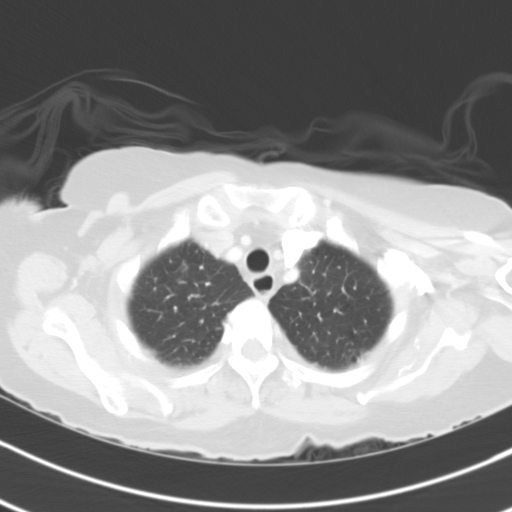
[im 62/67  lung]
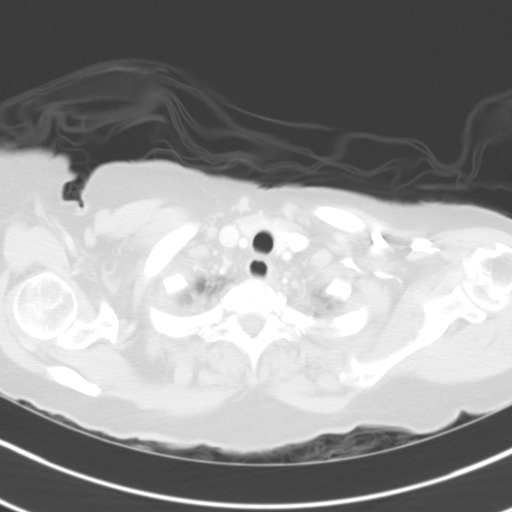

[Series 5: coronal · coronal · 0.65mm/px · 3 of 77 slices shown]
[im 16/77  lung]
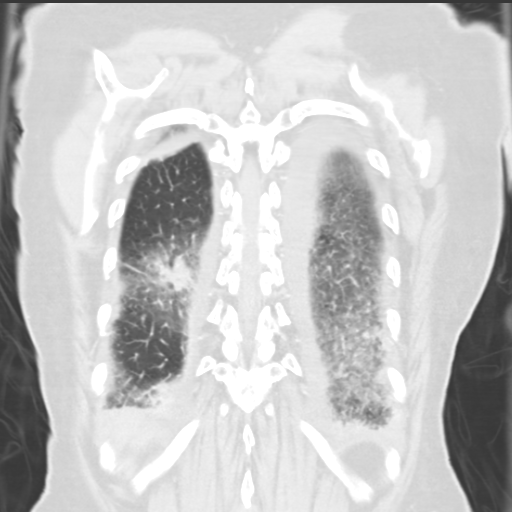
[im 31/77  lung]
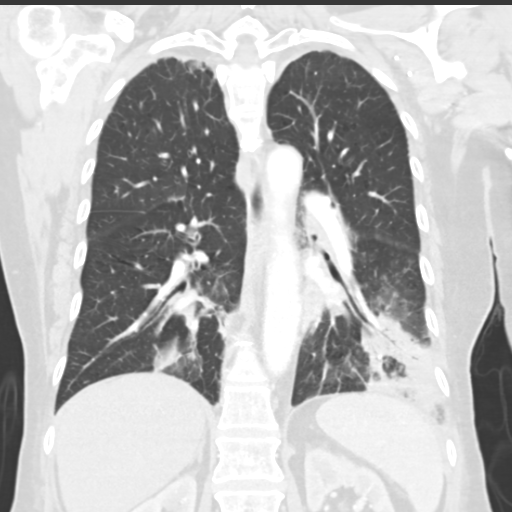
[im 46/77  lung]
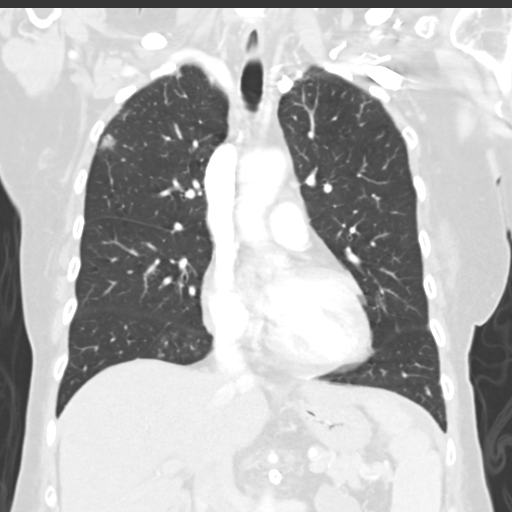

[15 of 36 positions shown; findings below may reference images not displayed]

FINDINGS: Visualized thyroid is unremarkable. 13 mm subcarinal lymph node. 22
mm left infrahilar lymph mass (image 39; series 2). 12 mm right
hilar lymph node. Aorta and main pulmonary artery are normal in
caliber.

Central airways are patent. Large area of consolidation within the
left lower lobe with scattered areas of consolidation in the right
lower lobe and right upper lobe. Surrounding ground-glass pulmonary
opacities. Small bilateral pleural effusions.

No pneumothorax.

Visualization of the upper abdomen demonstrates a small amount of
perisplenic fluid. The common bile duct is mildly dilated measuring
up to 9 mm. No aggressive or acute appearing osseous lesions.
IMPRESSION: Extensive bilateral predominantly lower lobe consolidative opacities
with surrounding ground-glass attenuation. Additionally there are
focal areas of consolidation within the right upper lobe. Overall
findings are most compatible with multi focal pneumonia.

Left infrahilar soft tissue mass is nonspecific however favored to
represent adenopathy, likely reactive given the extensive pulmonary
infectious process. Additional mediastinal and right hilar
adenopathy.

Given the above findings, recommend follow-up chest CT in 4- 6 weeks
after the resolution of acute symptomatology to evaluate for
interval improvement/resolution.

Small amount of perisplenic fluid.

Nonspecific dilatation of the common bile duct. Recommend
correlation with laboratory analysis.

## 2015-10-22 ENCOUNTER — Telehealth: Payer: Self-pay

## 2015-10-22 NOTE — Telephone Encounter (Signed)
Pt called back and wanted you to fax those documents to Attn: Noe GensLauren Shea, [phone].

## 2015-10-22 NOTE — Telephone Encounter (Signed)
Patient called me back with the fax number that she requested for me to send. Patient stated that she was seeing a new gastroenterologist, therefore, she requested to send her last office visit, colonoscopy and EGD.
